# Patient Record
Sex: Female | Born: 1995 | Race: White | Hispanic: No | Marital: Married | Smoking: Never smoker
Health system: Southern US, Community
[De-identification: ages and names within clinical notes are randomized; demographics above are authoritative.]

## PROBLEM LIST (undated history)

## (undated) ENCOUNTER — Inpatient Hospital Stay: Payer: Self-pay

## (undated) ENCOUNTER — Emergency Department (HOSPITAL_COMMUNITY): Payer: Medicaid Other

## (undated) DIAGNOSIS — IMO0002 Reserved for concepts with insufficient information to code with codable children: Secondary | ICD-10-CM

## (undated) DIAGNOSIS — G510 Bell's palsy: Secondary | ICD-10-CM

## (undated) DIAGNOSIS — M5430 Sciatica, unspecified side: Secondary | ICD-10-CM

---
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
## 2016-01-23 ENCOUNTER — Ambulatory Visit (INDEPENDENT_AMBULATORY_CARE_PROVIDER_SITE_OTHER): Payer: Self-pay | Admitting: Otolaryngology

## 2016-06-05 ENCOUNTER — Emergency Department (HOSPITAL_COMMUNITY)
Admission: EM | Admit: 2016-06-05 | Discharge: 2016-06-05 | Disposition: A | Discharge status: Home / Self Care | Payer: Medicaid Other | Attending: Emergency Medicine | Admitting: Emergency Medicine

## 2016-06-05 ENCOUNTER — Encounter (HOSPITAL_COMMUNITY): Payer: Self-pay | Admitting: Cardiology

## 2016-06-05 DIAGNOSIS — R1013 Epigastric pain: Secondary | ICD-10-CM | POA: Diagnosis present

## 2016-06-05 DIAGNOSIS — R109 Unspecified abdominal pain: Secondary | ICD-10-CM

## 2016-06-05 DIAGNOSIS — R112 Nausea with vomiting, unspecified: Secondary | ICD-10-CM | POA: Diagnosis not present

## 2016-06-05 DIAGNOSIS — R51 Headache: Secondary | ICD-10-CM | POA: Diagnosis not present

## 2016-06-05 DIAGNOSIS — K5900 Constipation, unspecified: Secondary | ICD-10-CM | POA: Diagnosis not present

## 2016-06-05 DIAGNOSIS — M791 Myalgia: Secondary | ICD-10-CM | POA: Diagnosis not present

## 2016-06-05 LAB — CBC WITH DIFFERENTIAL/PLATELET
BASOS ABS: 0 10*3/uL (ref 0.0–0.1)
Basophils Relative: 0 %
EOS PCT: 1 %
Eosinophils Absolute: 0.1 10*3/uL (ref 0.0–0.7)
HCT: 36.7 % (ref 36.0–46.0)
Hemoglobin: 12.8 g/dL (ref 12.0–15.0)
LYMPHS PCT: 15 %
Lymphs Abs: 1.3 10*3/uL (ref 0.7–4.0)
MCH: 29.8 pg (ref 26.0–34.0)
MCHC: 34.9 g/dL (ref 30.0–36.0)
MCV: 85.5 fL (ref 78.0–100.0)
Monocytes Absolute: 0.7 10*3/uL (ref 0.1–1.0)
Monocytes Relative: 8 %
NEUTROS PCT: 76 %
Neutro Abs: 6.5 10*3/uL (ref 1.7–7.7)
PLATELETS: 298 10*3/uL (ref 150–400)
RBC: 4.29 MIL/uL (ref 3.87–5.11)
RDW: 13 % (ref 11.5–15.5)
WBC: 8.7 10*3/uL (ref 4.0–10.5)

## 2016-06-05 LAB — BASIC METABOLIC PANEL
ANION GAP: 6 (ref 5–15)
BUN: 16 mg/dL (ref 6–20)
CO2: 25 mmol/L (ref 22–32)
Calcium: 9.3 mg/dL (ref 8.9–10.3)
Chloride: 105 mmol/L (ref 101–111)
Creatinine, Ser: 0.73 mg/dL (ref 0.44–1.00)
Glucose, Bld: 91 mg/dL (ref 65–99)
POTASSIUM: 3.6 mmol/L (ref 3.5–5.1)
SODIUM: 136 mmol/L (ref 135–145)

## 2016-06-05 LAB — HEPATIC FUNCTION PANEL
ALT: 13 U/L — ABNORMAL LOW (ref 14–54)
AST: 17 U/L (ref 15–41)
Albumin: 4.6 g/dL (ref 3.5–5.0)
Alkaline Phosphatase: 77 U/L (ref 38–126)
BILIRUBIN DIRECT: 0.2 mg/dL (ref 0.1–0.5)
BILIRUBIN INDIRECT: 0.5 mg/dL (ref 0.3–0.9)
TOTAL PROTEIN: 7.7 g/dL (ref 6.5–8.1)
Total Bilirubin: 0.7 mg/dL (ref 0.3–1.2)

## 2016-06-05 LAB — URINALYSIS, ROUTINE W REFLEX MICROSCOPIC
Bilirubin Urine: NEGATIVE
Glucose, UA: NEGATIVE mg/dL
Ketones, ur: NEGATIVE mg/dL
LEUKOCYTES UA: NEGATIVE
NITRITE: NEGATIVE
PROTEIN: NEGATIVE mg/dL
Specific Gravity, Urine: >1.03 — ABNORMAL HIGH (ref 1.005–1.030)
pH: 5.5 (ref 5.0–8.0)

## 2016-06-05 LAB — LIPASE, BLOOD: LIPASE: 14 U/L (ref 11–51)

## 2016-06-05 LAB — URINE MICROSCOPIC-ADD ON: WBC, UA: NONE SEEN WBC/hpf (ref 0–5)

## 2016-06-05 LAB — PREGNANCY, URINE: PREG TEST UR: NEGATIVE

## 2016-06-05 MED ORDER — ONDANSETRON HCL 4 MG/2ML IJ SOLN
4.0000 mg | Freq: Once | INTRAMUSCULAR | Status: AC
  Administered 2016-06-05: 4 mg via INTRAVENOUS
  Filled 2016-06-05: qty 2

## 2016-06-05 MED ORDER — SODIUM CHLORIDE 0.9 % IV BOLUS (SEPSIS)
1000.0000 mL | Freq: Once | INTRAVENOUS | Status: AC
  Administered 2016-06-05: 1000 mL via INTRAVENOUS

## 2016-06-05 MED ORDER — GI COCKTAIL ~~LOC~~
30.0000 mL | Freq: Once | ORAL | Status: AC
  Administered 2016-06-05: 30 mL via ORAL
  Filled 2016-06-05: qty 30

## 2016-06-05 MED ORDER — ONDANSETRON 4 MG PO TBDP: ORAL_TABLET | ORAL | Status: DC

## 2016-06-05 NOTE — ED Provider Notes (Signed)
CSN: [id number]     Arrival date & time 06/05/16  1033 History  By signing my name below, I, Tanda RockersMargaux Venter, attest that this documentation has been prepared under the direction and in the presence of Marily MemosJason Barnes Florek, MD. Electronically Signed: Tanda RockersMargaux Venter, ED Scribe. 06/05/2016. 12:03 PM    Chief Complaint  Patient presents with  . Abdominal Pain   The history is provided by the patient. No language interpreter was used.  HPI Comments: Donna Reeves is a 20 y.o. female who presents to the Emergency Department complaining of sudden onset, sharp epigastric pain onset yesterday, worsening this morning while at work. Pain is worsened by palpitation. She notes associated frequent urination, headache, body aches, constipation, and one prior episode of vomiting yesterday immediately after eating chicken tenders. No suspicious food intake. Pt is currently on her menstrual period. She denies sick contact. No recent foreign travel. No fever, sore throat, diarrhea., rash, other urinary or vaginal issues.   Past Medical History  Diagnosis Date  . Degenerative disc disease   . Herniated disc    History reviewed. No pertinent past surgical history. History reviewed. No pertinent family history. Social History  Substance Use Topics  . Smoking status: Never Smoker   . Smokeless tobacco: None  . Alcohol Use: No   OB History    Gravida Para Term Preterm AB TAB SAB Ectopic Multiple Living   0 0 0 0 0 0 0 0 0 0      Review of Systems  Constitutional: Negative for fever.  HENT: Negative for sore throat.   Gastrointestinal: Positive for nausea, vomiting, abdominal pain and constipation. Negative for diarrhea.  Genitourinary: Positive for frequency. Negative for dysuria, vaginal discharge, difficulty urinating and menstrual problem.  Musculoskeletal: Positive for myalgias.  Neurological: Positive for headaches.  All other systems reviewed and are negative.  Allergies  Ultram and Augmentin  Home  Medications   Prior to Admission medications   Medication Sig Start Date End Date Taking? Authorizing Provider  ondansetron (ZOFRAN ODT) 4 MG disintegrating tablet 4mg  ODT q4 hours prn nausea/vomit 06/05/16   Marily MemosJason Georgi Tuel, MD   BP 120/83 mmHg  Pulse 100  Temp(Src) 97.7 F (36.5 C) (Oral)  Resp 16  Ht 5\' 3"  (1.6 m)  Wt 102 lb (46.267 kg)  BMI 18.07 kg/m2  SpO2 100%  LMP 06/02/2016 Physical Exam  Constitutional: She is oriented to person, place, and time. She appears well-developed and well-nourished. No distress.  HENT:  Head: Normocephalic and atraumatic.  Eyes: Conjunctivae and EOM are normal.  Neck: Neck supple. No tracheal deviation present.  Cardiovascular: Normal rate, regular rhythm and normal heart sounds.   Pulmonary/Chest: Effort normal and breath sounds normal. No respiratory distress. She has no wheezes. She has no rales.  Abdominal: She exhibits no mass. There is tenderness. There is no rebound and no guarding.  Suprapubic and epigastric tenderness no rebound or guarding   Musculoskeletal: Normal range of motion.  Neurological: She is alert and oriented to person, place, and time.  Skin: Skin is warm and dry.  Psychiatric: She has a normal mood and affect. Her behavior is normal.  Nursing note and vitals reviewed.   ED Course  Procedures  DIAGNOSTIC STUDIES: Oxygen Saturation is 100% on RA, normal by my interpretation.    COORDINATION OF CARE: 11:20 AM-Discussed treatment plan which includes Lipase with pt at bedside and pt agreed to plan.   Labs Review Labs Reviewed  URINALYSIS, ROUTINE W REFLEX MICROSCOPIC ([facility]) -  Abnormal; Notable for the following:    Specific Gravity, Urine >1.030 (*)    Hgb urine dipstick LARGE (*)    All other components within normal limits  URINE MICROSCOPIC-ADD ON - Abnormal; Notable for the following:    Squamous Epithelial / LPF TOO NUMEROUS TO COUNT (*)    Bacteria, UA FEW (*)    All other components within normal  limits  HEPATIC FUNCTION PANEL - Abnormal; Notable for the following:    ALT 13 (*)    All other components within normal limits  PREGNANCY, URINE  BASIC METABOLIC PANEL  CBC WITH DIFFERENTIAL/PLATELET  LIPASE, BLOOD    Imaging Review No results found. I have personally reviewed and evaluated these lab results as part of my medical decision-making.   EKG Interpretation None      MDM   Final diagnoses:  Abdominal pain, unspecified abdominal location    Possibly mild food poisoning as a cause of her symptoms. Workup here is negative. Repeat abdominal exam is benign. Patient tolerating by mouth. Will return for any worsening symptoms.  New Prescriptions: Discharge Medication List as of 06/05/2016 12:58 PM    START taking these medications   Details  ondansetron (ZOFRAN ODT) 4 MG disintegrating tablet  ODT q4 hours prn nausea/vomit, Print         I have personally and contemperaneously reviewed labs and imaging and used in my decision making as above.   A medical screening exam was performed and I feel the patient has had an appropriate workup for their chief complaint at this time and likelihood of emergent condition existing is low and thus workup can continue on an outpatient basis.. Their vital signs are stable. They have been counseled on decision, discharge, follow up and which symptoms necessitate immediate return to the emergency department.  They verbally stated understanding and agreement with plan and discharged in stable condition.    I personally performed the services described in this documentation, which was scribed in my presence. The recorded information has been reviewed and is accurate.     Marily Memos, MD 06/05/16 1538

## 2016-06-05 NOTE — ED Notes (Addendum)
Epigastric pain since yesterday.  Vomited times 3.  Headache and achy today.  Also concerned about exposure to STD.  And possible pregnancy

## 2016-08-03 ENCOUNTER — Emergency Department (HOSPITAL_COMMUNITY): Payer: Medicaid Other

## 2016-08-03 ENCOUNTER — Emergency Department (HOSPITAL_COMMUNITY)
Admission: EM | Admit: 2016-08-03 | Discharge: 2016-08-03 | Disposition: A | Discharge status: Home / Self Care | Payer: Medicaid Other | Attending: Emergency Medicine | Admitting: Emergency Medicine

## 2016-08-03 ENCOUNTER — Encounter (HOSPITAL_COMMUNITY): Payer: Self-pay | Admitting: Emergency Medicine

## 2016-08-03 DIAGNOSIS — Z3490 Encounter for supervision of normal pregnancy, unspecified, unspecified trimester: Secondary | ICD-10-CM

## 2016-08-03 DIAGNOSIS — R102 Pelvic and perineal pain: Secondary | ICD-10-CM | POA: Diagnosis not present

## 2016-08-03 DIAGNOSIS — O26891 Other specified pregnancy related conditions, first trimester: Secondary | ICD-10-CM | POA: Diagnosis present

## 2016-08-03 LAB — BASIC METABOLIC PANEL
Anion gap: 5 (ref 5–15)
BUN: 10 mg/dL (ref 6–20)
CO2: 24 mmol/L (ref 22–32)
Calcium: 8.9 mg/dL (ref 8.9–10.3)
Chloride: 106 mmol/L (ref 101–111)
Creatinine, Ser: 0.53 mg/dL (ref 0.44–1.00)
GFR calc Af Amer: >60 mL/min (ref >60)
GLUCOSE: 88 mg/dL (ref 65–99)
POTASSIUM: 3.7 mmol/L (ref 3.5–5.1)
Sodium: 135 mmol/L (ref 135–145)

## 2016-08-03 LAB — CBC WITH DIFFERENTIAL/PLATELET
BASOS ABS: 0 10*3/uL (ref 0.0–0.1)
Basophils Relative: 0 %
EOS PCT: 2 %
Eosinophils Absolute: 0.2 10*3/uL (ref 0.0–0.7)
HCT: 33.6 % — ABNORMAL LOW (ref 36.0–46.0)
Hemoglobin: 12 g/dL (ref 12.0–15.0)
LYMPHS ABS: 2 10*3/uL (ref 0.7–4.0)
LYMPHS PCT: 19 %
MCH: 30.8 pg (ref 26.0–34.0)
MCHC: 35.7 g/dL (ref 30.0–36.0)
MCV: 86.4 fL (ref 78.0–100.0)
MONO ABS: 0.8 10*3/uL (ref 0.1–1.0)
Monocytes Relative: 8 %
Neutro Abs: 7.6 10*3/uL (ref 1.7–7.7)
Neutrophils Relative %: 71 %
PLATELETS: 287 10*3/uL (ref 150–400)
RBC: 3.89 MIL/uL (ref 3.87–5.11)
RDW: 12.8 % (ref 11.5–15.5)
WBC: 10.6 10*3/uL — ABNORMAL HIGH (ref 4.0–10.5)

## 2016-08-03 LAB — URINALYSIS, ROUTINE W REFLEX MICROSCOPIC
BILIRUBIN URINE: NEGATIVE
Glucose, UA: NEGATIVE mg/dL
HGB URINE DIPSTICK: NEGATIVE
KETONES UR: NEGATIVE mg/dL
Leukocytes, UA: NEGATIVE
Nitrite: NEGATIVE
Protein, ur: NEGATIVE mg/dL
SPECIFIC GRAVITY, URINE: 1.025 (ref 1.005–1.030)
pH: 6.5 (ref 5.0–8.0)

## 2016-08-03 LAB — WET PREP, GENITAL
Clue Cells Wet Prep HPF POC: NONE SEEN
Sperm: NONE SEEN
Trich, Wet Prep: NONE SEEN
Yeast Wet Prep HPF POC: NONE SEEN

## 2016-08-03 LAB — PREGNANCY, URINE: PREG TEST UR: POSITIVE — AB

## 2016-08-03 LAB — HCG, QUANTITATIVE, PREGNANCY: hCG, Beta Chain, Quant, S: 25210 m[IU]/mL — ABNORMAL HIGH (ref <5)

## 2016-08-03 MED ORDER — SODIUM CHLORIDE 0.9 % IV BOLUS (SEPSIS)
1000.0000 mL | Freq: Once | INTRAVENOUS | Status: AC
  Administered 2016-08-03: 1000 mL via INTRAVENOUS

## 2016-08-03 MED ORDER — GNP PRENATAL VITAMINS 28-0.8 MG PO TABS: 1.0000 | ORAL_TABLET | Freq: Every day | ORAL | 1 refills | Status: DC

## 2016-08-03 NOTE — ED Triage Notes (Signed)
Pt c/o lower abd cramping for a while and states she had a positive pregnancy test last week. Pt denies any vaginal discharge.

## 2016-08-03 NOTE — ED Provider Notes (Signed)
[facility] Provider Note   CSN: [id number] Arrival date & time: 08/03/16  [id number]  First Provider Contact:  First MD Initiated Contact with Patient 08/03/16 1923        History   Chief Complaint Chief Complaint  Patient presents with  . Abdominal Pain    HPI Donna Reeves is a 20 y.o. female.  Pt is here today with lower abdominal pain and cramping.  She took 2 pregnancy tests at home and both were positive.  Pt denies any bleeding.  She has called the obgyn and has made an appointment, but has not yet been seen.   The history is provided by the patient.  Abdominal Pain      Past Medical History:  Diagnosis Date  . Degenerative disc disease   . Herniated disc     Patient Active Problem List   Diagnosis Date Noted  . Low back pain 03/28/2013    History reviewed. No pertinent surgical history.  OB History    Gravida Para Term Preterm AB Living   0 0 0 0 0 0   SAB TAB Ectopic Multiple Live Births   0 0 0 0         Home Medications    Prior to Admission medications   Medication Sig Start Date End Date Taking? Authorizing Provider  Prenatal Vit-Fe Fumarate-FA (GNP PRENATAL VITAMINS) 28-0.8 MG TABS Take 1 tablet by mouth daily. 08/03/16   Zadie Rhineonald Wickline, MD    Family History No family history on file.  Social History Social History  Substance Use Topics  . Smoking status: Never Smoker  . Smokeless tobacco: Never Used  . Alcohol use No     Allergies   Ultram [tramadol] and Augmentin [amoxicillin-pot clavulanate]   Review of Systems Review of Systems  Gastrointestinal: Positive for abdominal pain.  Genitourinary: Positive for vaginal discharge.  All other systems reviewed and are negative.    Physical Exam Updated Vital Signs BP 110/70   Pulse 76   Temp 98.4 F (36.9 C)   Resp 16   Ht 5\' 3"  (1.6 m)   Wt 115 lb (52.2 kg)   LMP 07/01/2016   SpO2 100%   BMI 20.37 kg/m   Physical Exam  Constitutional: She is oriented to  person, place, and time. She appears well-developed and well-nourished.  HENT:  Head: Normocephalic and atraumatic.  Right Ear: External ear normal.  Left Ear: External ear normal.  Nose: Nose normal.  Mouth/Throat: Oropharynx is clear and moist.  Eyes: Conjunctivae and EOM are normal. Pupils are equal, round, and reactive to light.  Neck: Normal range of motion. Neck supple.  Cardiovascular: Normal rate, regular rhythm, normal heart sounds and intact distal pulses.   Pulmonary/Chest: Effort normal and breath sounds normal.  Abdominal: Soft. There is tenderness in the suprapubic area.  Genitourinary: Uterus normal. Pelvic exam was performed with patient supine. Cervix exhibits discharge. Right adnexum displays no tenderness. Left adnexum displays no tenderness. Vaginal discharge found.  Musculoskeletal: Normal range of motion.  Neurological: She is alert and oriented to person, place, and time. She has normal reflexes.  Skin: Skin is warm and dry.  Psychiatric: She has a normal mood and affect. Her behavior is normal. Judgment and thought content normal.  Nursing note and vitals reviewed.    ED Treatments / Results  Labs (all labs ordered are listed, but only abnormal results are displayed) Labs Reviewed  WET PREP, GENITAL - Abnormal; Notable for the following:  Result Value   WBC, Wet Prep HPF POC FEW (*)    All other components within normal limits  PREGNANCY, URINE - Abnormal; Notable for the following:    Preg Test, Ur POSITIVE (*)    All other components within normal limits  CBC WITH DIFFERENTIAL/PLATELET - Abnormal; Notable for the following:    WBC 10.6 (*)    HCT 33.6 (*)    All other components within normal limits  HCG, QUANTITATIVE, PREGNANCY - Abnormal; Notable for the following:    hCG, Beta Chain, Quant, S 25,210 (*)    All other components within normal limits  URINALYSIS, ROUTINE W REFLEX MICROSCOPIC (NOT AT [facility])  BASIC METABOLIC PANEL  GC/CHLAMYDIA  PROBE AMP ([place]) NOT AT [facility]    EKG  EKG Interpretation None       Radiology US Ob Comp Less 14 Wks  Result Date: 08/03/2016 CLINICAL DATA:  Pelvic pain for 2 weeks.  Patient is pregnant. EXAM: OBSTETRIC <14 WK [place] AND TRANSVAGINAL OB US TECHNIQUE: Both transabdominal and transvaginal ultrasound examinations were performed for complete evaluation of the gestation as well as the maternal uterus, adnexal regions, and pelvic cul-de-sac. Transvaginal technique was performed to assess early pregnancy. COMPARISON:  None. FINDINGS: Intrauterine gestational sac: Present Yolk sac:  Present Embryo:  Present Cardiac Activity: [place] Heart Rate: 99  bpm MSD:   mm    w     d CRL:  5.3 mm  mm   6 w   2 d                  [place] EDC: 03/28/2017 Subchorionic hemorrhage:  None visualized. Maternal uterus/adnexae: Normal right ovary. Normal left ovary. Trace free pelvic fluid. IMPRESSION: Single living intrauterine embryo estimated at 6 weeks and 2 days gestation. Low heart rate of 99 beats per minute. No subchorionic hemorrhage. Normal ovaries. Electronically Signed   By: Rudie Meyer M.D.   On: 08/03/2016 21:54   US Ob Transvaginal  Result Date: 08/03/2016 CLINICAL DATA:  Pelvic pain for 2 weeks.  Patient is pregnant. EXAM: OBSTETRIC <14 WK [place] AND TRANSVAGINAL OB US TECHNIQUE: Both transabdominal and transvaginal ultrasound examinations were performed for complete evaluation of the gestation as well as the maternal uterus, adnexal regions, and pelvic cul-de-sac. Transvaginal technique was performed to assess early pregnancy. COMPARISON:  None. FINDINGS: Intrauterine gestational sac: Present Yolk sac:  Present Embryo:  Present Cardiac Activity: [place] Heart Rate: 99  bpm MSD:   mm    w     d CRL:  5.3 mm  mm   6 w   2 d                  [place] EDC: 03/28/2017 Subchorionic hemorrhage:  None visualized. Maternal uterus/adnexae: Normal right ovary. Normal left ovary. Trace free pelvic fluid. IMPRESSION: Single living  intrauterine embryo estimated at 6 weeks and 2 days gestation. Low heart rate of 99 beats per minute. No subchorionic hemorrhage. Normal ovaries. Electronically Signed   By: Rudie Meyer M.D.   On: 08/03/2016 21:54    Procedures Procedures (including critical care time)  Medications Ordered in ED Medications  sodium chloride 0.9 % bolus 1,000 mL (0 mLs Intravenous Stopped 08/03/16 2158)     Initial Impression / Assessment and Plan / ED Course  I have reviewed the triage vital signs and the nursing notes.  Pertinent labs & imaging results that were available during my care of the patient were reviewed by  me and considered in my medical decision making (see chart for details).  Clinical Course   Pt's [place] is pending and pt will be signed out to Dr. Bebe Shaggy.   Final Clinical Impressions(s) / ED Diagnoses   Final diagnoses:  Pregnancy    New Prescriptions Discharge Medication List as of 08/03/2016 10:09 PM    START taking these medications   Details  Prenatal Vit-Fe Fumarate-FA (GNP PRENATAL VITAMINS) 28-0.8 MG TABS Take 1 tablet by mouth daily., Starting Mon 08/03/2016, Print         Jacalyn Lefevre, MD 08/04/16 [phone]

## 2016-08-03 NOTE — ED Provider Notes (Signed)
US imaging reveals IUP No other acute findings Pt stable She feels well for d/c home We discussed return precautions Referred to local OBGYN Rx for prenatal vitamins    Zadie Rhineonald Tammie Ellsworth, MD 08/03/16 2212

## 2016-08-04 LAB — GC/CHLAMYDIA PROBE AMP (~~LOC~~) NOT AT ARMC
CHLAMYDIA, DNA PROBE: NEGATIVE
NEISSERIA GONORRHEA: NEGATIVE

## 2016-08-18 LAB — OB RESULTS CONSOLE ABO/RH: RH TYPE: POSITIVE

## 2016-08-18 LAB — OB RESULTS CONSOLE ANTIBODY SCREEN: ANTIBODY SCREEN: NEGATIVE

## 2016-08-20 LAB — OB RESULTS CONSOLE HIV ANTIBODY (ROUTINE TESTING): HIV: NONREACTIVE

## 2016-08-20 LAB — OB RESULTS CONSOLE HEPATITIS B SURFACE ANTIGEN: Hepatitis B Surface Ag: NEGATIVE

## 2016-08-20 LAB — OB RESULTS CONSOLE RUBELLA ANTIBODY, IGM: RUBELLA: NON-IMMUNE/NOT IMMUNE

## 2016-08-25 ENCOUNTER — Emergency Department (HOSPITAL_COMMUNITY)
Admission: EM | Admit: 2016-08-25 | Discharge: 2016-08-25 | Disposition: A | Discharge status: Home / Self Care | Payer: Medicaid Other | Attending: Emergency Medicine | Admitting: Emergency Medicine

## 2016-08-25 ENCOUNTER — Encounter (HOSPITAL_COMMUNITY): Payer: Self-pay

## 2016-08-25 ENCOUNTER — Emergency Department (HOSPITAL_COMMUNITY): Payer: Medicaid Other

## 2016-08-25 DIAGNOSIS — R55 Syncope and collapse: Secondary | ICD-10-CM | POA: Diagnosis not present

## 2016-08-25 DIAGNOSIS — R109 Unspecified abdominal pain: Secondary | ICD-10-CM | POA: Insufficient documentation

## 2016-08-25 DIAGNOSIS — R51 Headache: Secondary | ICD-10-CM | POA: Diagnosis not present

## 2016-08-25 DIAGNOSIS — O219 Vomiting of pregnancy, unspecified: Secondary | ICD-10-CM

## 2016-08-25 DIAGNOSIS — Z79899 Other long term (current) drug therapy: Secondary | ICD-10-CM | POA: Insufficient documentation

## 2016-08-25 DIAGNOSIS — Z3A08 8 weeks gestation of pregnancy: Secondary | ICD-10-CM | POA: Insufficient documentation

## 2016-08-25 DIAGNOSIS — R059 Cough, unspecified: Secondary | ICD-10-CM

## 2016-08-25 DIAGNOSIS — R05 Cough: Secondary | ICD-10-CM | POA: Diagnosis not present

## 2016-08-25 DIAGNOSIS — R519 Headache, unspecified: Secondary | ICD-10-CM

## 2016-08-25 LAB — URINALYSIS, ROUTINE W REFLEX MICROSCOPIC
Glucose, UA: NEGATIVE mg/dL
Hgb urine dipstick: NEGATIVE
LEUKOCYTES UA: NEGATIVE
NITRITE: NEGATIVE
PH: 6 (ref 5.0–8.0)
PROTEIN: 30 mg/dL — AB
Specific Gravity, Urine: >1.03 — ABNORMAL HIGH (ref 1.005–1.030)

## 2016-08-25 LAB — CBC WITH DIFFERENTIAL/PLATELET
Basophils Absolute: 0 10*3/uL (ref 0.0–0.1)
Basophils Relative: 0 %
EOS ABS: 0.1 10*3/uL (ref 0.0–0.7)
EOS PCT: 0 %
HCT: 38.5 % (ref 36.0–46.0)
HEMOGLOBIN: 13.7 g/dL (ref 12.0–15.0)
LYMPHS ABS: 1.2 10*3/uL (ref 0.7–4.0)
LYMPHS PCT: 7 %
MCH: 30.6 pg (ref 26.0–34.0)
MCHC: 35.6 g/dL (ref 30.0–36.0)
MCV: 85.9 fL (ref 78.0–100.0)
MONOS PCT: 6 %
Monocytes Absolute: 1 10*3/uL (ref 0.1–1.0)
Neutro Abs: 14.4 10*3/uL — ABNORMAL HIGH (ref 1.7–7.7)
Neutrophils Relative %: 87 %
PLATELETS: 316 10*3/uL (ref 150–400)
RBC: 4.48 MIL/uL (ref 3.87–5.11)
RDW: 12.5 % (ref 11.5–15.5)
WBC: 16.6 10*3/uL — ABNORMAL HIGH (ref 4.0–10.5)

## 2016-08-25 LAB — RAPID STREP SCREEN (MED CTR MEBANE ONLY): Streptococcus, Group A Screen (Direct): NEGATIVE

## 2016-08-25 LAB — URINE MICROSCOPIC-ADD ON

## 2016-08-25 LAB — COMPREHENSIVE METABOLIC PANEL
ALK PHOS: 61 U/L (ref 38–126)
ALT: 15 U/L (ref 14–54)
ANION GAP: 10 (ref 5–15)
AST: 17 U/L (ref 15–41)
Albumin: 4.7 g/dL (ref 3.5–5.0)
BUN: 12 mg/dL (ref 6–20)
CALCIUM: 9.7 mg/dL (ref 8.9–10.3)
CO2: 22 mmol/L (ref 22–32)
CREATININE: 0.47 mg/dL (ref 0.44–1.00)
Chloride: 103 mmol/L (ref 101–111)
Glucose, Bld: 83 mg/dL (ref 65–99)
Potassium: 3.5 mmol/L (ref 3.5–5.1)
SODIUM: 135 mmol/L (ref 135–145)
Total Bilirubin: 1.2 mg/dL (ref 0.3–1.2)
Total Protein: 7.9 g/dL (ref 6.5–8.1)

## 2016-08-25 MED ORDER — SODIUM CHLORIDE 0.9 % IV BOLUS (SEPSIS)
1000.0000 mL | Freq: Once | INTRAVENOUS | Status: AC
  Administered 2016-08-25: 1000 mL via INTRAVENOUS

## 2016-08-25 MED ORDER — ONDANSETRON 4 MG PO TBDP: ORAL_TABLET | ORAL | 0 refills | Status: DC

## 2016-08-25 MED ORDER — ONDANSETRON HCL 4 MG/2ML IJ SOLN
4.0000 mg | Freq: Once | INTRAMUSCULAR | Status: AC
  Administered 2016-08-25: 4 mg via INTRAVENOUS
  Filled 2016-08-25: qty 2

## 2016-08-25 MED ORDER — ACETAMINOPHEN 500 MG PO TABS
1000.0000 mg | ORAL_TABLET | Freq: Once | ORAL | Status: AC
  Administered 2016-08-25: 1000 mg via ORAL
  Filled 2016-08-25: qty 2

## 2016-08-25 NOTE — Discharge Instructions (Signed)
If you were given medicines take as directed.  If you are on coumadin or contraceptives realize their levels and effectiveness is altered by many different medicines.  If you have any reaction (rash, tongues swelling, other) to the medicines stop taking and see a physician.    If your blood pressure was elevated in the ER make sure you follow up for management with a primary doctor or return for chest pain, shortness of breath or stroke symptoms.  Please follow up as directed and return to the ER or see a physician for new or worsening symptoms.  Thank you. Vitals:   08/25/16 1230 08/25/16 1300 08/25/16 1330 08/25/16 1345  BP: 112/69 100/62 98/62   Pulse: 69 65    Resp: 17 16  22   Temp:      TempSrc:      SpO2: 100% 97%    Weight:      Height:

## 2016-08-25 NOTE — ED Notes (Signed)
Pt ambulated to BR without difficulty

## 2016-08-25 NOTE — ED Triage Notes (Signed)
Pt reports is approx 9 or [redacted] weeks pregnant.  Reports has had a headache and pain behind ears for the past 2 days.  Pt says last night she was in the shower and passed out and hit her head.  Pt also says has been having n/v.

## 2016-08-25 NOTE — ED Provider Notes (Signed)
[facility] Provider Note   CSN: [id number] Arrival date & time: 08/25/16  1030  By signing my name below, I, [name], attest that this documentation has been prepared under the direction and in the presence of Blane Ohara, MD. Electronically Signed: [name], ED Scribe. 08/25/16. 11:29 AM.   History   Chief Complaint Chief Complaint  Patient presents with  . Migraine    HPI  Donna Reeves is a 20 y.o. female who presents to the Emergency Department complaining of constant, pressure, HA onset 2 days. Pt notes that last night she was in the shower when she felt lightheaded and passed out and struck her head on the tub. Pt reports that she is approximately 2 months pregnant with adequate OB follow up with her next appointment on 09/02/2016. Pt states that this is her first pregnancy.  Pt is having associated symptoms of chills, bilateral ear pain, lightheadedness, lower abdominal pain, nausea, and SOB. Pt denies any new sexual partners at this time. She notes that she has not tried any medications for the relief of her symptoms. She denies fever, vaginal bleeding, vaginal discharge, and any other symptoms. Denies hx of DVT, PE, GERD, issues with gallbladder, miscarriages, or ectopic pregnancy.   The history is provided by the patient. No language interpreter was used.  Migraine  This is a new problem. The current episode started more than 2 days ago. The problem occurs rarely. The problem has not changed since onset.Associated symptoms include abdominal pain (lower) and headaches. Pertinent negatives include no shortness of breath. Nothing aggravates the symptoms. Nothing relieves the symptoms. She has tried nothing for the symptoms. The treatment provided no relief.    Past Medical History:  Diagnosis Date  . Degenerative disc disease   . Herniated disc     Patient Active Problem List   Diagnosis Date Noted  . Low back pain 03/28/2013    History reviewed. No  pertinent surgical history.  OB History    Gravida Para Term Preterm AB Living   1 0 0 0 0 0   SAB TAB Ectopic Multiple Live Births   0 0 0 0         Home Medications    Prior to Admission medications   Medication Sig Start Date End Date Taking? Authorizing Provider  Prenatal Vit-Fe Fumarate-FA (GNP PRENATAL VITAMINS) 28-0.8 MG TABS Take 1 tablet by mouth daily. 08/03/16  Yes Zadie Rhine, MD  ondansetron (ZOFRAN ODT) 4 MG disintegrating tablet 4mg  ODT q4 hours prn nausea/vomit 08/25/16   Blane Ohara, MD    Family History No family history on file.  Social History Social History  Substance Use Topics  . Smoking status: Never Smoker  . Smokeless tobacco: Never Used  . Alcohol use No     Allergies   Ultram [tramadol] and Augmentin [amoxicillin-pot clavulanate]   Review of Systems Review of Systems  Constitutional: Positive for chills. Negative for fever.  HENT: Positive for ear pain.   Respiratory: Positive for cough and chest tightness. Negative for shortness of breath.   Gastrointestinal: Positive for abdominal pain (lower) and nausea.  Genitourinary: Negative for vaginal bleeding and vaginal discharge.  Neurological: Positive for syncope, light-headedness and headaches.  All other systems reviewed and are negative.    Physical Exam Updated Vital Signs BP 98/62   Pulse 65   Temp 98 F (36.7 C) (Oral)   Resp 22   Ht 5\' 3"  (1.6 m)   Wt 106 lb (48.1 kg)  LMP 07/01/2016   SpO2 97%   BMI 18.78 kg/m   Physical Exam  Constitutional: She is oriented to person, place, and time. She appears well-developed and well-nourished. No distress.  HENT:  Head: Normocephalic and atraumatic.  Eyes: EOM are normal.  Visual fields intact to finger movements.   Neck: Neck supple.  Cardiovascular: Normal rate, regular rhythm and normal heart sounds.  Exam reveals no gallop and no friction rub.   No murmur heard. Pulmonary/Chest: Effort normal and breath sounds normal.  No respiratory distress. She has no wheezes. She has no rales.  Abdominal: Soft. She exhibits no distension. There is tenderness in the epigastric area. There is no guarding.  Musculoskeletal: Normal range of motion. She exhibits no edema.  No BLE swelling  Neurological: She is alert and oriented to person, place, and time.  Finger-to-nose intact. Equal strength to BUE.  Skin: Skin is warm and dry.  Psychiatric: She has a normal mood and affect. Her behavior is normal.  Nursing note and vitals reviewed.    ED Treatments / Results  DIAGNOSTIC STUDIES: Oxygen Saturation is 100% on RA, nl by my interpretation.    COORDINATION OF CARE: 11:26 AM Discussed treatment plan with pt at bedside which includes EKG, IV fluids, zofran, labs, tylenol and pt agreed to plan.   Labs (all labs ordered are listed, but only abnormal results are displayed) Labs Reviewed  CBC WITH DIFFERENTIAL/PLATELET - Abnormal; Notable for the following:       Result Value   WBC 16.6 (*)    Neutro Abs 14.4 (*)    All other components within normal limits  URINALYSIS, ROUTINE W REFLEX MICROSCOPIC (NOT AT [facility]) - Abnormal; Notable for the following:    Specific Gravity, Urine >1.030 (*)    Bilirubin Urine SMALL (*)    Ketones, ur >80 (*)    Protein, ur 30 (*)    All other components within normal limits  URINE MICROSCOPIC-ADD ON - Abnormal; Notable for the following:    Squamous Epithelial / LPF TOO NUMEROUS TO COUNT (*)    Bacteria, UA FEW (*)    All other components within normal limits  RAPID STREP SCREEN (NOT AT [facility])  URINE CULTURE  COMPREHENSIVE METABOLIC PANEL    EKG  EKG Interpretation  Date/Time:  Tuesday August 25 2016 11:15:55 EDT Ventricular Rate:  55 PR Interval:    QRS Duration: 97 QT Interval:  452 QTC Calculation: 433 R Axis:   89 Text Interpretation:  Sinus arrhythmia Prolonged PR interval Baseline wander in lead(s) V1 Confirmed by Chailyn Racette MD, Melady Chow [phone]([id number]) on 08/25/2016 1:49:20 PM        Radiology Dg Chest 2 View  Result Date: 08/25/2016 CLINICAL DATA:  Cough for 3 weeks EXAM: CHEST  2 VIEW COMPARISON:  None. FINDINGS: Lungs are clear. Heart size and pulmonary vascularity are normal. No adenopathy. No bone lesions. IMPRESSION: No edema or consolidation. Electronically Signed   By: Bretta BangWilliam  Woodruff III M.D.   On: 08/25/2016 14:03    Procedures Procedures (including critical care time)  Medications Ordered in ED Medications  sodium chloride 0.9 % bolus 1,000 mL (0 mLs Intravenous Stopped 08/25/16 1312)  acetaminophen (TYLENOL) tablet 1,000 mg (1,000 mg Oral Given 08/25/16 1144)  ondansetron (ZOFRAN) injection 4 mg (4 mg Intravenous Given 08/25/16 1144)     Initial Impression / Assessment and Plan / ED Course  I have reviewed the triage vital signs and the nursing notes.  Pertinent labs & imaging results that were  available during my care of the patient were reviewed by me and considered in my medical decision making (see chart for details).  Clinical Course   Patient presents with multiple different symptoms. Recurrent nausea and vomiting since pregnancy with lightheaded and syncope yesterday. Mild chest discomfort and coughing.  No SOB.  No other PE risk factors, vitals unremarkable.    Pt has wbc elevation, UA and CXR unremarkable, strep pending. UA consistent with dehydration, IV fluids given.  Recent GC/ vaginitis neg, reviewed, no new sexual partners or new dc/ bleeding.    Pt improved on reassessment.  Discussed close fup outpt with OB and pcp. Smiling at dc, tolerating po Results and differential diagnosis were discussed with the patient/parent/guardian. Xrays were independently reviewed by myself.  Close follow up outpatient was discussed, comfortable with the plan.   Medications  sodium chloride 0.9 % bolus 1,000 mL (0 mLs Intravenous Stopped 08/25/16 1312)  acetaminophen (TYLENOL) tablet 1,000 mg (1,000 mg Oral Given 08/25/16 1144)  ondansetron  (ZOFRAN) injection 4 mg (4 mg Intravenous Given 08/25/16 1144)    Vitals:   08/25/16 1230 08/25/16 1300 08/25/16 1330 08/25/16 1345  BP: 112/69 100/62 98/62   Pulse: 69 65    Resp: 17 16  22   Temp:      TempSrc:      SpO2: 100% 97%    Weight:      Height:        Final diagnoses:  Nausea/vomiting in pregnancy  Headache, unspecified headache type  Cough  Syncope and collapse     Final Clinical Impressions(s) / ED Diagnoses   Final diagnoses:  Nausea/vomiting in pregnancy  Headache, unspecified headache type  Cough  Syncope and collapse    New Prescriptions New Prescriptions   ONDANSETRON (ZOFRAN ODT) 4 MG DISINTEGRATING TABLET    4mg  ODT q4 hours prn nausea/vomit      Blane Ohara, MD 08/25/16 1515

## 2016-08-27 LAB — URINE CULTURE

## 2016-08-28 LAB — CULTURE, GROUP A STREP (THRC)

## 2016-11-16 ENCOUNTER — Encounter (HOSPITAL_COMMUNITY): Payer: Self-pay | Admitting: Emergency Medicine

## 2016-11-16 ENCOUNTER — Emergency Department (HOSPITAL_COMMUNITY)
Admission: EM | Admit: 2016-11-16 | Discharge: 2016-11-16 | Disposition: A | Discharge status: Home / Self Care | Payer: Medicaid Other | Attending: Emergency Medicine | Admitting: Emergency Medicine

## 2016-11-16 DIAGNOSIS — Z79899 Other long term (current) drug therapy: Secondary | ICD-10-CM | POA: Diagnosis not present

## 2016-11-16 DIAGNOSIS — O26892 Other specified pregnancy related conditions, second trimester: Secondary | ICD-10-CM | POA: Diagnosis not present

## 2016-11-16 DIAGNOSIS — N898 Other specified noninflammatory disorders of vagina: Secondary | ICD-10-CM | POA: Insufficient documentation

## 2016-11-16 DIAGNOSIS — Z3A21 21 weeks gestation of pregnancy: Secondary | ICD-10-CM

## 2016-11-16 DIAGNOSIS — R109 Unspecified abdominal pain: Secondary | ICD-10-CM | POA: Diagnosis not present

## 2016-11-16 LAB — CBC WITH DIFFERENTIAL/PLATELET
Basophils Absolute: 0 10*3/uL (ref 0.0–0.1)
Basophils Relative: 0 %
EOS ABS: 0.1 10*3/uL (ref 0.0–0.7)
EOS PCT: 1 %
HCT: 32.4 % — ABNORMAL LOW (ref 36.0–46.0)
HEMOGLOBIN: 11.4 g/dL — AB (ref 12.0–15.0)
LYMPHS ABS: 1.7 10*3/uL (ref 0.7–4.0)
Lymphocytes Relative: 11 %
MCH: 31.1 pg (ref 26.0–34.0)
MCHC: 35.2 g/dL (ref 30.0–36.0)
MCV: 88.3 fL (ref 78.0–100.0)
MONO ABS: 0.9 10*3/uL (ref 0.1–1.0)
MONOS PCT: 6 %
Neutro Abs: 12.6 10*3/uL — ABNORMAL HIGH (ref 1.7–7.7)
Neutrophils Relative %: 82 %
Platelets: 265 10*3/uL (ref 150–400)
RBC: 3.67 MIL/uL — ABNORMAL LOW (ref 3.87–5.11)
RDW: 12.8 % (ref 11.5–15.5)
WBC: 15.4 10*3/uL — ABNORMAL HIGH (ref 4.0–10.5)

## 2016-11-16 LAB — URINALYSIS, ROUTINE W REFLEX MICROSCOPIC
BILIRUBIN URINE: NEGATIVE
GLUCOSE, UA: NEGATIVE mg/dL
HGB URINE DIPSTICK: NEGATIVE
Ketones, ur: 15 mg/dL — AB
Leukocytes, UA: NEGATIVE
Nitrite: NEGATIVE
PH: 6.5 (ref 5.0–8.0)
Protein, ur: NEGATIVE mg/dL
SPECIFIC GRAVITY, URINE: 1.02 (ref 1.005–1.030)

## 2016-11-16 LAB — COMPREHENSIVE METABOLIC PANEL
ALK PHOS: 61 U/L (ref 38–126)
ALT: 11 U/L — ABNORMAL LOW (ref 14–54)
ANION GAP: 9 (ref 5–15)
AST: 15 U/L (ref 15–41)
Albumin: 3.8 g/dL (ref 3.5–5.0)
BUN: 9 mg/dL (ref 6–20)
CALCIUM: 9 mg/dL (ref 8.9–10.3)
CO2: 22 mmol/L (ref 22–32)
Chloride: 103 mmol/L (ref 101–111)
Creatinine, Ser: 0.47 mg/dL (ref 0.44–1.00)
GFR calc non Af Amer: >60 mL/min (ref >60)
Glucose, Bld: 73 mg/dL (ref 65–99)
Potassium: 3.4 mmol/L — ABNORMAL LOW (ref 3.5–5.1)
SODIUM: 134 mmol/L — AB (ref 135–145)
TOTAL PROTEIN: 7.2 g/dL (ref 6.5–8.1)
Total Bilirubin: 0.7 mg/dL (ref 0.3–1.2)

## 2016-11-16 LAB — LIPASE, BLOOD: Lipase: 14 U/L (ref 11–51)

## 2016-11-16 NOTE — ED Triage Notes (Signed)
5 months Pregnant continues to have right side pain x 1 month, [facility]
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
## 2016-12-07 LAB — OB RESULTS CONSOLE GC/CHLAMYDIA: Gonorrhea: NEGATIVE

## 2016-12-26 LAB — OB RESULTS CONSOLE GC/CHLAMYDIA: Chlamydia: NEGATIVE

## 2016-12-28 NOTE — L&D Delivery Note (Signed)
Vaginal Delivery Note  21 y.o. G1P0000 at [redacted]w[redacted]d delivered a viable female infant at 23 in cephalic, compound OP position. Body cord x1. Left anterior shoulder delivered with ease. Sixty sec delayed cord clamping. Cord clamped x2 and cut. Placenta delivered spontaneously intact, with 3VC. Fundus firm on exam with massage and pitocin.  Mother: Anesthesia: Epidural Laceration: None Suture repair: N/A EBL: 150 mL  Baby: Apgars: 8, 9 Weight: Pending Cord pH: Not sent  Good hemostasis noted. Mom to postpartum.  Baby to Couplet care / Skin to Skin.  Wendee Beavers, DO, PGY-1 03/25/2017, 4:07 AM  Patient is a G1 at [redacted]w[redacted]d who was admitted with SOL/SROM, uncomplicated prenatal course other than +THC (prenatal care in [place], records rev'd).  She progressed without augmentation.  I was gloved and present for delivery in its entirety.  Second stage of labor progressed to SVD.  Mild decels during second stage noted.  Complications: none  Lacerations: none  EBL: 150cc  Cam Hai, CNM 7:24 AM  03/25/2017

## 2016-12-31 LAB — OB RESULTS CONSOLE RPR: RPR: NONREACTIVE

## 2017-02-27 LAB — OB RESULTS CONSOLE GBS: GBS: NEGATIVE

## 2017-03-25 ENCOUNTER — Encounter (HOSPITAL_COMMUNITY): Payer: Self-pay

## 2017-03-25 ENCOUNTER — Inpatient Hospital Stay (HOSPITAL_COMMUNITY): Payer: Medicaid Other | Admitting: Anesthesiology

## 2017-03-25 ENCOUNTER — Inpatient Hospital Stay (HOSPITAL_COMMUNITY)
Admission: AD | Admit: 2017-03-25 | Discharge: 2017-03-26 | DRG: 775 | Disposition: A | Discharge status: Home / Self Care | Payer: Medicaid Other | Source: Ambulatory Visit | Attending: Obstetrics & Gynecology | Admitting: Obstetrics & Gynecology

## 2017-03-25 DIAGNOSIS — O326XX0 Maternal care for compound presentation, not applicable or unspecified: Secondary | ICD-10-CM | POA: Diagnosis present

## 2017-03-25 DIAGNOSIS — Z3A39 39 weeks gestation of pregnancy: Secondary | ICD-10-CM

## 2017-03-25 DIAGNOSIS — K219 Gastro-esophageal reflux disease without esophagitis: Secondary | ICD-10-CM | POA: Diagnosis present

## 2017-03-25 DIAGNOSIS — Z3493 Encounter for supervision of normal pregnancy, unspecified, third trimester: Secondary | ICD-10-CM | POA: Diagnosis present

## 2017-03-25 DIAGNOSIS — O9962 Diseases of the digestive system complicating childbirth: Secondary | ICD-10-CM | POA: Diagnosis present

## 2017-03-25 DIAGNOSIS — O4202 Full-term premature rupture of membranes, onset of labor within 24 hours of rupture: Secondary | ICD-10-CM | POA: Diagnosis present

## 2017-03-25 DIAGNOSIS — O134 Gestational [pregnancy-induced] hypertension without significant proteinuria, complicating childbirth: Principal | ICD-10-CM | POA: Diagnosis present

## 2017-03-25 DIAGNOSIS — O139 Gestational [pregnancy-induced] hypertension without significant proteinuria, unspecified trimester: Secondary | ICD-10-CM | POA: Diagnosis present

## 2017-03-25 LAB — CBC
HCT: 31.8 % — ABNORMAL LOW (ref 36.0–46.0)
Hemoglobin: 11.5 g/dL — ABNORMAL LOW (ref 12.0–15.0)
MCH: 31.4 pg (ref 26.0–34.0)
MCHC: 36.2 g/dL — AB (ref 30.0–36.0)
MCV: 86.9 fL (ref 78.0–100.0)
PLATELETS: 287 10*3/uL (ref 150–400)
RBC: 3.66 MIL/uL — ABNORMAL LOW (ref 3.87–5.11)
RDW: 13.3 % (ref 11.5–15.5)
WBC: 11.3 10*3/uL — AB (ref 4.0–10.5)

## 2017-03-25 LAB — RAPID URINE DRUG SCREEN, HOSP PERFORMED
Amphetamines: NOT DETECTED
BARBITURATES: NOT DETECTED
BENZODIAZEPINES: NOT DETECTED
COCAINE: NOT DETECTED
OPIATES: NOT DETECTED
TETRAHYDROCANNABINOL: POSITIVE — AB

## 2017-03-25 LAB — COMPREHENSIVE METABOLIC PANEL
ALBUMIN: 3.2 g/dL — AB (ref 3.5–5.0)
ALT: 14 U/L (ref 14–54)
ANION GAP: 10 (ref 5–15)
AST: 19 U/L (ref 15–41)
Alkaline Phosphatase: 147 U/L — ABNORMAL HIGH (ref 38–126)
BUN: 8 mg/dL (ref 6–20)
CHLORIDE: 106 mmol/L (ref 101–111)
CO2: 19 mmol/L — AB (ref 22–32)
Calcium: 9.1 mg/dL (ref 8.9–10.3)
Creatinine, Ser: 0.69 mg/dL (ref 0.44–1.00)
GFR calc non Af Amer: >60 mL/min (ref >60)
GLUCOSE: 84 mg/dL (ref 65–99)
POTASSIUM: 4 mmol/L (ref 3.5–5.1)
SODIUM: 135 mmol/L (ref 135–145)
Total Bilirubin: 0.4 mg/dL (ref 0.3–1.2)
Total Protein: 6.7 g/dL (ref 6.5–8.1)

## 2017-03-25 LAB — ABO/RH: ABO/RH(D): A POS

## 2017-03-25 LAB — PROTEIN / CREATININE RATIO, URINE
Creatinine, Urine: 49 mg/dL
PROTEIN CREATININE RATIO: 0.55 mg/mg{creat} — AB (ref 0.00–0.15)
TOTAL PROTEIN, URINE: 27 mg/dL

## 2017-03-25 LAB — TYPE AND SCREEN
ABO/RH(D): A POS
ANTIBODY SCREEN: NEGATIVE

## 2017-03-25 MED ORDER — PHENYLEPHRINE 40 MCG/ML (10ML) SYRINGE FOR IV PUSH (FOR BLOOD PRESSURE SUPPORT)
80.0000 ug | PREFILLED_SYRINGE | INTRAVENOUS | Status: DC | PRN
  Filled 2017-03-25: qty 5
  Filled 2017-03-25: qty 10

## 2017-03-25 MED ORDER — FENTANYL 2.5 MCG/ML BUPIVACAINE 1/10 % EPIDURAL INFUSION (WH - ANES)
14.0000 mL/h | INTRAMUSCULAR | Status: DC | PRN
  Administered 2017-03-25: 14 mL/h via EPIDURAL
  Filled 2017-03-25: qty 100

## 2017-03-25 MED ORDER — DIPHENHYDRAMINE HCL 50 MG/ML IJ SOLN: 12.5000 mg | INTRAMUSCULAR | Status: DC | PRN

## 2017-03-25 MED ORDER — BENZOCAINE-MENTHOL 20-0.5 % EX AERO
1.0000 "application " | INHALATION_SPRAY | CUTANEOUS | Status: DC | PRN
  Filled 2017-03-25: qty 56

## 2017-03-25 MED ORDER — LACTATED RINGERS IV SOLN
INTRAVENOUS | Status: DC
  Administered 2017-03-25: 03:00:00 via INTRAVENOUS

## 2017-03-25 MED ORDER — OXYCODONE-ACETAMINOPHEN 5-325 MG PO TABS: 2.0000 | ORAL_TABLET | ORAL | Status: DC | PRN

## 2017-03-25 MED ORDER — OXYTOCIN BOLUS FROM INFUSION
500.0000 mL | Freq: Once | INTRAVENOUS | Status: AC
  Administered 2017-03-25: 500 mL via INTRAVENOUS

## 2017-03-25 MED ORDER — MEASLES, MUMPS & RUBELLA VAC ~~LOC~~ INJ
0.5000 mL | INJECTION | Freq: Once | SUBCUTANEOUS | Status: AC
  Administered 2017-03-26: 0.5 mL via SUBCUTANEOUS
  Filled 2017-03-25: qty 0.5

## 2017-03-25 MED ORDER — PRENATAL MULTIVITAMIN CH
1.0000 | ORAL_TABLET | Freq: Every day | ORAL | Status: DC
  Administered 2017-03-25 – 2017-03-26 (×2): 1 via ORAL
  Filled 2017-03-25 (×2): qty 1

## 2017-03-25 MED ORDER — ONDANSETRON HCL 4 MG/2ML IJ SOLN: 4.0000 mg | INTRAMUSCULAR | Status: DC | PRN

## 2017-03-25 MED ORDER — LACTATED RINGERS IV SOLN
500.0000 mL | Freq: Once | INTRAVENOUS | Status: AC
  Administered 2017-03-25: 500 mL via INTRAVENOUS

## 2017-03-25 MED ORDER — DIBUCAINE 1 % RE OINT: 1.0000 "application " | TOPICAL_OINTMENT | RECTAL | Status: DC | PRN

## 2017-03-25 MED ORDER — COCONUT OIL OIL: 1.0000 "application " | TOPICAL_OIL | Status: DC | PRN

## 2017-03-25 MED ORDER — LIDOCAINE HCL (PF) 1 % IJ SOLN
INTRAMUSCULAR | Status: DC | PRN
  Administered 2017-03-25 (×2): 4 mL via EPIDURAL

## 2017-03-25 MED ORDER — ACETAMINOPHEN 325 MG PO TABS: 650.0000 mg | ORAL_TABLET | ORAL | Status: DC | PRN

## 2017-03-25 MED ORDER — SOD CITRATE-CITRIC ACID 500-334 MG/5ML PO SOLN: 30.0000 mL | ORAL | Status: DC | PRN

## 2017-03-25 MED ORDER — EPHEDRINE 5 MG/ML INJ
10.0000 mg | INTRAVENOUS | Status: DC | PRN
  Filled 2017-03-25: qty 2

## 2017-03-25 MED ORDER — SENNOSIDES-DOCUSATE SODIUM 8.6-50 MG PO TABS
2.0000 | ORAL_TABLET | ORAL | Status: DC
  Administered 2017-03-25: 2 via ORAL
  Filled 2017-03-25: qty 2

## 2017-03-25 MED ORDER — ONDANSETRON HCL 4 MG PO TABS
4.0000 mg | ORAL_TABLET | ORAL | Status: DC | PRN
  Administered 2017-03-25: 4 mg via ORAL
  Filled 2017-03-25: qty 1

## 2017-03-25 MED ORDER — TETANUS-DIPHTH-ACELL PERTUSSIS 5-2.5-18.5 LF-MCG/0.5 IM SUSP
0.5000 mL | Freq: Once | INTRAMUSCULAR | Status: AC
  Administered 2017-03-26: 0.5 mL via INTRAMUSCULAR

## 2017-03-25 MED ORDER — OXYCODONE-ACETAMINOPHEN 5-325 MG PO TABS: 1.0000 | ORAL_TABLET | ORAL | Status: DC | PRN

## 2017-03-25 MED ORDER — LACTATED RINGERS IV SOLN: 500.0000 mL | INTRAVENOUS | Status: DC | PRN

## 2017-03-25 MED ORDER — DIPHENHYDRAMINE HCL 25 MG PO CAPS: 25.0000 mg | ORAL_CAPSULE | Freq: Four times a day (QID) | ORAL | Status: DC | PRN

## 2017-03-25 MED ORDER — FLEET ENEMA 7-19 GM/118ML RE ENEM: 1.0000 | ENEMA | RECTAL | Status: DC | PRN

## 2017-03-25 MED ORDER — WITCH HAZEL-GLYCERIN EX PADS: 1.0000 "application " | MEDICATED_PAD | CUTANEOUS | Status: DC | PRN

## 2017-03-25 MED ORDER — IBUPROFEN 600 MG PO TABS
600.0000 mg | ORAL_TABLET | Freq: Four times a day (QID) | ORAL | Status: DC
  Administered 2017-03-25 – 2017-03-26 (×6): 600 mg via ORAL
  Filled 2017-03-25 (×6): qty 1

## 2017-03-25 MED ORDER — PHENYLEPHRINE 40 MCG/ML (10ML) SYRINGE FOR IV PUSH (FOR BLOOD PRESSURE SUPPORT)
80.0000 ug | PREFILLED_SYRINGE | INTRAVENOUS | Status: DC | PRN
  Administered 2017-03-25 (×2): 80 ug via INTRAVENOUS
  Filled 2017-03-25: qty 5

## 2017-03-25 MED ORDER — SIMETHICONE 80 MG PO CHEW
80.0000 mg | CHEWABLE_TABLET | ORAL | Status: DC | PRN
Start: 2017-03-25 — End: 2017-03-26
  Administered 2017-03-26: 80 mg via ORAL

## 2017-03-25 MED ORDER — OXYTOCIN 40 UNITS IN LACTATED RINGERS INFUSION - SIMPLE MED
2.5000 [IU]/h | INTRAVENOUS | Status: DC
  Filled 2017-03-25: qty 1000

## 2017-03-25 MED ORDER — LIDOCAINE HCL (PF) 1 % IJ SOLN
30.0000 mL | INTRAMUSCULAR | Status: DC | PRN
  Filled 2017-03-25: qty 30

## 2017-03-25 MED ORDER — ZOLPIDEM TARTRATE 5 MG PO TABS: 5.0000 mg | ORAL_TABLET | Freq: Every evening | ORAL | Status: DC | PRN

## 2017-03-25 MED ORDER — EPHEDRINE 5 MG/ML INJ
10.0000 mg | INTRAVENOUS | Status: DC | PRN
Start: 2017-03-25 — End: 2017-03-25
  Filled 2017-03-25: qty 2

## 2017-03-25 MED ORDER — ONDANSETRON HCL 4 MG/2ML IJ SOLN: 4.0000 mg | Freq: Four times a day (QID) | INTRAMUSCULAR | Status: DC | PRN

## 2017-03-25 NOTE — H&P (Signed)
LABOR AND DELIVERY ADMISSION HISTORY AND PHYSICAL NOTE  Donna Reeves is a 21 y.o. female G1P0000 with IUP at [redacted]w[redacted]d by 10 wk [place] presenting for SOL. She reports spontaneous rupture of membrane with clear leakage of fluid having an onset of contractions with worsening frequency and intensity since midnight. She denies vaginal bleeding and reports positive fetal movement. Prenatal care was performed in [place]. This is her first pregnancy.  Prenatal History/Complications: Chlamydia during pregnancy (treated, TOC 12/09/16) THC positive during pregnancy first trimester  Past Medical History: Past Medical History:  Diagnosis Date  . Degenerative disc disease   . Herniated disc     Past Surgical History: History reviewed. No pertinent surgical history.  Obstetrical History: OB History    Gravida Para Term Preterm AB Living   1 0 0 0 0 0   SAB TAB Ectopic Multiple Live Births   0 0 0 0        Social History: Social History   Social History  . Marital status: Single    Spouse name: N/A  . Number of children: N/A  . Years of education: N/A   Social History Main Topics  . Smoking status: Never Smoker  . Smokeless tobacco: Never Used  . Alcohol use No  . Drug use: No  . Sexual activity: Not Asked   Other Topics Concern  . None   Social History Narrative  . None    Family History: History reviewed. No pertinent family history.  Allergies: Allergies  Allergen Reactions  . Ultram [Tramadol] Nausea Only and Other (See Comments)    headache  . Augmentin [Amoxicillin-Pot Clavulanate] Other (See Comments)    Unknown.    Prescriptions Prior to Admission  Medication Sig Dispense Refill Last Dose  . ondansetron (ZOFRAN ODT) 4 MG disintegrating tablet 4mg  ODT q4 hours prn nausea/vomit (Patient taking differently: Take 4 mg by mouth every 8 (eight) hours as needed for nausea or vomiting. ) 10 tablet 0 unknown  . Prenatal Vit-Fe Fumarate-FA (GNP PRENATAL VITAMINS) 28-0.8 MG  TABS Take 1 tablet by mouth daily. (Patient taking differently: Take 1 tablet by mouth daily. Gummies) 30 tablet 1 11/15/2016 at Unknown time     Review of Systems   All systems reviewed and negative except as stated in HPI  Blood pressure 125/75, pulse 75, temperature 97.5 F (36.4 C), temperature source Oral, resp. rate 18, last menstrual period 07/01/2016. General appearance: alert, cooperative and moderate distress Lungs: clear to auscultation bilaterally Heart: regular rate and rhythm Abdomen: soft, non-tender; bowel sounds normal Extremities: No calf swelling or tenderness Presentation: cephalic Fetal monitoring: Baseline 110 bpm, moderate variability, accelerations present, decelerations Uterine activity: Regular Q3 mins Dilation: 4 Effacement (%): 90 Station: -1 Exam by:: Kayren Eaves RN   Prenatal labs: ABO, Rh: A positive Antibody: Negative Rubella: Nonimmune RPR: Nonreactive HBsAg: Nonreactive HIV: Nonreactive GBS: Negative 1 hr Glucola: Normal Genetic screening:  Normal Anatomy US: Normal  Prenatal Transfer Tool  Maternal Diabetes: No Genetic Screening: Normal Maternal Ultrasounds/Referrals: Normal Fetal Ultrasounds or other Referrals:  None Maternal Substance Abuse:  Yes:  Type: Marijuana Significant Maternal Medications:  None Significant Maternal Lab Results: Lab values include: Group B Strep negative  No results found for this or any previous visit (from the past 24 hour(s)).  Patient Active Problem List   Diagnosis Date Noted  . Normal labor 03/25/2017  . Low back pain 03/28/2013    Assessment: Donna Reeves is a 21 y.o. G1P0000 at [redacted]w[redacted]d here for SOL. SROM  at midnight prior to arrival. Asking for epidural.  #Labor:SOL #Pain: Epidural #FWB: Category I #ID:  GBS negative #MOF: Breast #MOC:Depo #Circ:  Outpatient  Wendee Beaversavid J McMullen, DO, PGY-1 03/25/2017, 2:26 AM   CNM attestation:  I have seen and examined this patient; I agree  with above documentation in the resident's note.   Donna Reeves Scholler is a 21 y.o. G1P1001 here for SOL/SROM  PE: BP (!) 157/99 (BP Location: Left Arm)   Pulse 75   Temp 97.6 F (36.4 C) (Oral)   Resp 18   Ht 5\' 1"  (1.549 m)   Wt 57.6 kg (127 lb)   LMP 07/01/2016   SpO2 100%   Breastfeeding? Unknown   BMI 24.00 kg/m  Gen: calm comfortable, NAD Resp: normal effort, no distress Abd: gravid  ROS, labs, PMH reviewed  [facility]: [facility] Prenatal records rev'd Anticipate SVD  Cam HaiSHAW, Meliyah Simon CNM 03/25/2017, 7:19 AM

## 2017-03-25 NOTE — Anesthesia Preprocedure Evaluation (Signed)
Anesthesia Evaluation  Patient identified by MRN, date of birth, ID band Patient awake    Reviewed: Allergy & Precautions, NPO status , Patient's Chart, lab work & pertinent test results  Airway Mallampati: II  TM Distance: >3 FB Neck ROM: Full    Dental no notable dental hx. (+) Teeth Intact   Pulmonary neg pulmonary ROS,    Pulmonary exam normal breath sounds clear to auscultation       Cardiovascular negative cardio ROS Normal cardiovascular exam Rhythm:Regular Rate:Normal     Neuro/Psych negative neurological ROS  negative psych ROS   GI/Hepatic Neg liver ROS, GERD  ,  Endo/Other  negative endocrine ROS  Renal/GU negative Renal ROS  negative genitourinary   Musculoskeletal  (+) Arthritis ,   Abdominal   Peds  Hematology  (+) anemia ,   Anesthesia Other Findings   Reproductive/Obstetrics (+) Pregnancy                             Anesthesia Physical Anesthesia Plan  ASA: II  Anesthesia Plan: Epidural   Post-op Pain Management:    Induction:   Airway Management Planned: Natural Airway  Additional Equipment:   Intra-op Plan:   Post-operative Plan:   Informed Consent: I have reviewed the patients History and Physical, chart, labs and discussed the procedure including the risks, benefits and alternatives for the proposed anesthesia with the patient or authorized representative who has indicated his/her understanding and acceptance.     Plan Discussed with: Anesthesiologist  Anesthesia Plan Comments:         Anesthesia Quick Evaluation

## 2017-03-25 NOTE — Lactation Note (Signed)
This note was copied from a baby's chart. Lactation Consultation Note  Patient Name: Donna Reeves Today's Date: 03/25/2017 Reason for consult: Initial assessment Attempted to assist Mom with latching baby since baby awake. Baby would not latch at this time, drops of colostrum dripped in baby's mouth with hand expression. Mom reports baby just spit up small amount of mucous [facility]. Basic teaching reviewed with Mom. Encouraged to BF with feeding ques. Lactation brochure left for review, advised of OP services and support group. Encouraged to call for assist with feedings. Left baby STS on Mom.   Maternal Data Has patient been taught Hand Expression?: Yes Does the patient have breastfeeding experience prior to this delivery?: No  Feeding Feeding Type: Breast Fed Length of feed: 0 min  LATCH Score/Interventions Latch: Too sleepy or reluctant, no latch achieved, no sucking elicited.                    Lactation Tools Discussed/Used WIC Program: Yes   Consult Status Consult Status: Follow-up Date: 03/26/17 Follow-up type: In-patient    Alfred LevinsGranger, Silveria Botz Ann 03/25/2017, 11:05 AM

## 2017-03-25 NOTE — MAU Note (Signed)
Pt reports SROM at 0000-clear. Contractions. +FM

## 2017-03-25 NOTE — Anesthesia Procedure Notes (Signed)
Epidural Patient location during procedure: OB Start time: 03/25/2017 2:52 AM  Staffing Anesthesiologist: Mal AmabileFOSTER, Fenix Rorke Performed: anesthesiologist   Preanesthetic Checklist Completed: patient identified, site marked, surgical consent, pre-op evaluation, timeout performed, IV checked, risks and benefits discussed and monitors and equipment checked  Epidural Patient position: sitting Prep: site prepped and draped and DuraPrep Patient monitoring: continuous pulse ox and blood pressure Approach: midline Location: L3-L4 Injection technique: LOR air  Needle:  Needle type: Tuohy  Needle gauge: 17 G Needle length: 9 cm and 9 Needle insertion depth: 4 cm Catheter type: closed end flexible Catheter size: 19 Gauge Catheter at skin depth: 9 cm Test dose: negative and Other  Assessment Events: blood not aspirated, injection not painful, no injection resistance, negative IV test and no paresthesia  Additional Notes Patient identified. Risks and benefits discussed including failed block, incomplete  Pain control, post dural puncture headache, nerve damage, paralysis, blood pressure Changes, nausea, vomiting, reactions to medications-both toxic and allergic and post Partum back pain. All questions were answered. Patient expressed understanding and wished to proceed. Sterile technique was used throughout procedure. Epidural site was Dressed with sterile barrier dressing. No paresthesias, signs of intravascular injection Or signs of intrathecal spread were encountered.  Patient was more comfortable after the epidural was dosed. Please see RN's note for documentation of vital signs and FHR which are stable.

## 2017-03-25 NOTE — Anesthesia Postprocedure Evaluation (Signed)
Anesthesia Post Note  Patient: Donna Reeves  Procedure(s) Performed: * No procedures listed *  Patient location during evaluation: Mother Baby Anesthesia Type: Epidural Level of consciousness: awake and alert Pain management: satisfactory to patient Vital Signs Assessment: post-procedure vital signs reviewed and stable Respiratory status: respiratory function stable Cardiovascular status: stable Postop Assessment: no headache, no backache, epidural receding, patient able to bend at knees, no signs of nausea or vomiting and adequate PO intake Anesthetic complications: no        Last Vitals:  Vitals:   03/25/17 0525 03/25/17 0700  BP: (!) 157/99 139/75  Pulse: 75 63  Resp: 18 16  Temp: 36.4 C 37 C    Last Pain:  Vitals:   03/25/17 0700  TempSrc: Oral  PainSc:    Pain Goal:                 Donna Reeves

## 2017-03-26 DIAGNOSIS — O139 Gestational [pregnancy-induced] hypertension without significant proteinuria, unspecified trimester: Secondary | ICD-10-CM | POA: Diagnosis present

## 2017-03-26 LAB — RPR: RPR: NONREACTIVE

## 2017-03-26 NOTE — Lactation Note (Signed)
This note was copied from a baby's chart. Lactation Consultation Note  Patient Name: Donna Reeves Today's Date: 03/26/2017 Reason for consult: Follow-up assessment;Infant < 6lbs Baby is now 79 hours old.  He had a slow start with feeding yesterday but has begun to latch and feed well today.  Baby currently sleepy.  Mom recently pumped and hand expressed 5 mls of colostrum. Attempted cup feeding baby but baby would not extend tongue.  Baby given colostrum using a slow flow nipple. It did take several minutes to elicit sucking.  Instructed mom to put baby to breast with any feeding cue and call for help as needed.  Instructed to continue to hand express/pump every 3 hours and give baby any expressed milk by cup or bottle.  Maternal Data    Feeding Feeding Type: Breast Fed Length of feed: 45 min  LATCH Score/Interventions Latch: Too sleepy or reluctant, no latch achieved, no sucking elicited. Intervention(s): Skin to skin;Teach feeding cues;Waking techniques Intervention(s): Breast compression;Breast massage;Assist with latch;Adjust position  Audible Swallowing: None Intervention(s): Hand expression  Type of Nipple: Everted at rest and after stimulation  Comfort (Breast/Nipple): Soft / non-tender     Hold (Positioning): Assistance needed to correctly position infant at breast and maintain latch. Intervention(s): Breastfeeding basics reviewed;Support Pillows;Position options;Skin to skin  LATCH Score: 5  Lactation Tools Discussed/Used Tools: Bottle;Feeding cup Breast pump type: Double-Electric Breast Pump   Consult Status Consult Status: Follow-up Date: 03/27/17 Follow-up type: In-patient    Donna Reeves 03/26/2017, 2:23 PM

## 2017-03-26 NOTE — Lactation Note (Addendum)
This note was copied from a baby's chart. Lactation Consultation Note Baby had 7% weight loss at 20 hrs old. Baby was re-weighed. Hasn't been interested in BF. Out put is 7 voids, 6 stools, and 1 emesis. Increased output is probable cause for weight loss.  Stressed importance of I&O w/mom.  Taught mom hand expression. Discussed supplementing baby after BF w/colostrum for now. Hand expressed 5 ml.  In football position attempted to latch baby. Had no interested, wouldn't even open wide when cried. Mom done STS during attempted feeding.  Taught spoon feeding, baby took 5 ml's well.  Mom encouraged to feed baby 8-12 times/24 hours and with feeding cues. Mom encouraged to waken baby for feeds.  Mom shown how to use DEBP & how to disassemble, clean, & reassemble parts. Mom knows to pump q3h for 15-20 min.  Newborn behavior and feeding habits reviewed. Discussed breast massage during feedings. WH/LC brochure given w/resources, support groups and LC services.  Patient Name: Donna Reeves [id number] Date: 03/26/2017 Reason for consult: Follow-up assessment;Infant weight loss;Infant < 6lbs   Maternal Data    Feeding Feeding Type: Breast Milk Length of feed: 0 min  LATCH Score/Interventions Latch: Too sleepy or reluctant, no latch achieved, no sucking elicited. Intervention(s): Skin to skin;Teach feeding cues;Waking techniques Intervention(s): Adjust position;Assist with latch;Breast massage;Breast compression  Audible Swallowing: None Intervention(s): Skin to skin;Hand expression Intervention(s): Alternate breast massage  Type of Nipple: Everted at rest and after stimulation  Comfort (Breast/Nipple): Soft / non-tender     Hold (Positioning): Full assist, staff holds infant at breast Intervention(s): Breastfeeding basics reviewed;Support Pillows;Position options;Skin to skin  LATCH Score: 4  Lactation Tools Discussed/Used Tools: Pump Breast pump type: Double-Electric Breast  Pump WIC Program: Yes Pump Review: Setup, frequency, and cleaning;Milk Storage Initiated by:: Peri Jefferson RN IBCLC Date initiated:: 03/26/17   Consult Status Consult Status: Follow-up Date: 03/26/17 Follow-up type: In-patient    Donna Reeves, [name] 03/26/2017, 2:17 AM

## 2017-03-26 NOTE — Progress Notes (Signed)
CSW made a 2nd attempt to contact [place] CPS via telephoneto make a report for infant's positive UDS for THC.  CSW has requested a call back from CPS.  If CSW is unable to make a report today, CSW will have the weekend CSW to follow-up with CPS.    There are no barriers to d/c for the family.   Keene Gilkey Boyd-Gilyard, MSW, LCSW Clinical Social Work [phone]  

## 2017-03-26 NOTE — Progress Notes (Signed)
CSW left a voicemail message regarding making a CPS report for a positive UDS for [facility] CPS.  CSW requested a telephone call back.  Blaine Hamper, MSW, LCSW Clinical Social Work [phone]

## 2017-03-26 NOTE — Discharge Summary (Signed)
OB Discharge Summary     Patient Name: Donna Reeves DOB: December 16, 1996 MRN: [id number]  Date of admission: 03/25/2017 Delivering MD: Wendee Beavers   Date of discharge: 03/26/2017  Admitting diagnosis: 39 WEEKS CTX ROM Intrauterine pregnancy: [redacted]w[redacted]d     Secondary diagnosis:  Active Problems:   Normal labor   Gestational hypertension  Additional problems: Gestational HTN     Discharge diagnosis: Term Pregnancy Delivered and Gestational Hypertension                                                                                                Post partum procedures:None  Augmentation: None  Complications: None  Hospital course:  Onset of Labor With Vaginal Delivery     21 y.o. yo G1P1001 at [redacted]w[redacted]d was admitted in Active Labor on 03/25/2017. Patient had an uncomplicated labor course as follows:  Membrane Rupture Time/Date: 12:00 AM ,03/25/2017   Intrapartum Procedures: Episiotomy: None [1]                                         Lacerations:    None Patient had a delivery of a Viable infant. 03/25/2017  Information for the patient's newborn:  Donna Reeves [[id number]]  Delivery Method: Vaginal, Spontaneous Delivery (Filed from Delivery Summary)    Pateint had an uncomplicated postpartum course.  She is ambulating, tolerating a regular diet, passing flatus, and urinating well. Patient is discharged home in stable condition on 03/26/17.   Physical exam  Vitals:   03/25/17 1945 03/26/17 0000 03/26/17 0530 03/26/17 0800  BP: 135/88 132/86 126/81 119/72  Pulse: 67 68 63 60  Resp:   18 16  Temp:   98.1 F (36.7 C) 98.9 F (37.2 C)  TempSrc:   Oral Oral  SpO2:   96% 98%  Weight:      Height:       General: alert, cooperative and no distress Lochia: appropriate Uterine Fundus: firm Incision: N/A DVT Evaluation: No evidence of DVT seen on physical exam. Labs: Lab Results  Component Value Date   WBC 11.3 (H) 03/25/2017   HGB 11.5 (L) 03/25/2017   HCT 31.8 (L)  03/25/2017   MCV 86.9 03/25/2017   PLT 287 03/25/2017   CMP Latest Ref Rng & Units 03/25/2017  Glucose 65 - 99 mg/dL 84  BUN 6 - 20 mg/dL 8  Creatinine 8.65 - 7.84 mg/dL 6.96  Sodium 295 - 284 mmol/L 135  Potassium 3.5 - 5.1 mmol/L 4.0  Chloride 101 - 111 mmol/L 106  CO2 22 - 32 mmol/L 19(L)  Calcium 8.9 - 10.3 mg/dL 9.1  Total Protein 6.5 - 8.1 g/dL 6.7  Total Bilirubin 0.3 - 1.2 mg/dL 0.4  Alkaline Phos 38 - 126 U/L 147(H)  AST 15 - 41 U/L 19  ALT 14 - 54 U/L 14    Discharge instruction: per After Visit Summary and "Baby and Me Booklet".  After visit meds:  Allergies as of 03/26/2017      Reactions   Ultram [tramadol]  Nausea Only, Other (See Comments)   headache   Augmentin [amoxicillin-pot Clavulanate] Other (See Comments)   Unknown.      Medication List    TAKE these medications   GNP PRENATAL VITAMINS 28-0.8 MG Tabs Take 1 tablet by mouth daily. What changed:  additional instructions   ondansetron 4 MG disintegrating tablet Commonly known as:  ZOFRAN ODT  ODT q4 hours prn nausea/vomit What changed:  how much to take  how to take this  when to take this  reasons to take this  additional instructions       Diet: low salt diet  Activity: Advance as tolerated. Pelvic rest for 6 weeks.   Outpatient follow up:1 week for blood pressure check Follow up Appt:No future appointments. Follow up Visit:No Follow-up on file.  Postpartum contraception: Depo Provera  Newborn Data: Live born female  Birth Weight: 6 lb 4.9 oz (2860 g) APGAR: 8, 9  Baby Feeding: Breast Disposition:home with mother   03/26/2017 [name], MD   CNM Note:  Patient ambulating in the room; vital signs stable at this time. Denies dizziness, blurry vision, HA or epigastric pain. Patient will follow up with her provider on 03-29-2017 for a blood pressure check [facility] in [place], [place]).  I confirm that I have verified the information documented in the resident's  note and that I have also personally reperformed the physical exam and all medical decision making activities. Luna Kitchens CNM

## 2017-03-26 NOTE — Clinical Social Work Maternal (Signed)
  CLINICAL SOCIAL WORK MATERNAL/CHILD NOTE  Patient Details  Name: Donna Reeves MRN: [id number] Date of Birth: 10/29/1996  Date:  03/26/2017  Clinical Social Worker Initiating Note:  Jaiyla Granados Boyd-Gilyard Date/ Time Initiated:  03/26/17/1227     Child's Name:  Donna Reeves   Legal Guardian:  Mother (FOB Romeo Reeves 05/20/1994)   Need for Interpreter:  None   Date of Referral:  03/26/17     Reason for Referral:  Current Substance Use/Substance Use During Pregnancy  (hx of THC use during pregnancy. )   Referral Source:  Central Nursery   Address:  [address] [place] [address]  Phone number:  [phone]   Household Members:  Siblings, Self (MOB resides with MOB's grandmother. )   Natural Supports (not living in the home):  Spouse/significant other   Professional Supports: None   Employment: Unemployed   Type of Work:     Education:  High school graduate   Financial Resources:  Medicaid   Other Resources:  WIC   Cultural/Religious Considerations Which May Impact Care:  None Reported  Strengths:  Ability to meet basic needs , Home prepared for child    Risk Factors/Current Problems:  Substance Use    Cognitive State:  Able to Concentrate , Alert , Linear Thinking    Mood/Affect:  Bright , Interested , Comfortable    CSW Assessment: CSW met with MOB for a consult of hx of THC use during pregnancy.  MOB was polite, easily engaged, and receptive.  MOB gave CSW permission to meet with MOB while FOB was present. CSW inquired about MOB supports and MOB communicated that the FOB and MOB's immediate and extended family are supportive. CSW inquired about MOB's marijuana use and MOB was not forthcoming about MOB marijuana use.  MOB reported MOB's last use of marijuana was about 3 months ago.  CSW made MOB aware that the infant's UDS would not be positive if MOB's last use was 3 months ago.  MOB stated that MOB last used marijuana in January 2018. CSW informed MOB of  the hospital's drug screen policy, and informed MOB of the two screenings for the infant. MOB was understanding and again admitted to utilizing marijuana during pregnancy. CSW thanked MOB for being honest, and informed MOB that the infant had a positive UDS CSW is going to make a report with [place] CPS. CSW also made MOB aware that CSW will monitor infant's CDS and will report results to CPS as well. MOB and FOB was understanding and did not have any questions or concerns. CSW offered resources and referrals for SA treatment and MOB declined. CSW educated MOB about PPD. CSW informed MOB of possible supports and interventions to decrease PPD.  CSW also encouraged MOB to seek medical attention if needed for increased signs and symptoms for PPD. CSW reviewed SIDS; MOB was knowledgeable and asked appropriate questions.  MOB communicated that she has a crib for the baby, and feels prepared for the infant.  MOB did not have any further questions, concerns, or needs at this time.  CSW thanked MOB and FOB for meeting with CSW and provided the parents with CSW's contact information.   CSW Plan/Description:  Information/Referral to Community Resources , Patient/Family Education , Child Protective Service Report    Nargis Abrams Boyd-Gilyard, MSW, LCSW Clinical Social Work [phone]   Trinty Marken D BOYD-GILYARD, LCSW 03/26/2017, 12:31 PM  

## 2017-03-27 ENCOUNTER — Ambulatory Visit: Payer: Self-pay

## 2017-03-27 NOTE — Progress Notes (Signed)
CPS report made to [place] on call social worker Simrit Pulliam. At this time, there are no barriers to d/c.   Randen Kauth, MSW, LCSW-A Clinical Social Worker  [facility]  Office: [phone]  

## 2017-03-27 NOTE — Lactation Note (Signed)
This note was copied from a baby's chart. Lactation Consultation Note; Infant is at 10 % weight loss. Infant has had 6 wet diapers and 4 stools. Mother states that the last stool was green.  Mother just finished feeding infant . She breastfed infant and then she offered a bottle with Alemintum. Mother states that infant refused the bottle.  Mother described slight pain with pumping.  Assist mother with post pumping breast. Mother fit with the #24 flange.  Mother breast are flowing with colostrum . Her breast are feeling heavy. Mother advised to breastfeed infant well and do good breast massage while feeding. Mother to continue to breastfeed and supplement infant with EBM/ formula. Mother advised to massage and place ice on swollen breast for 15 mins every 3-4 hours.   Mother advised to page for lactation assistance when she finishes pumping. Mother very receptive to teaching.   Patient Name: Donna Reeves [id number] Date: 03/27/2017 Reason for consult: Follow-up assessment   Maternal Data    Feeding Feeding Type: Breast Fed Length of feed: 20 min  LATCH Score/Interventions                      Lactation Tools Discussed/Used     Consult Status      Donna Reeves 03/27/2017, 10:05 AM

## 2017-03-27 NOTE — Lactation Note (Signed)
This note was copied from a baby's chart. Lactation Consultation Note: Mother reports that infant breastfed and then took 8 ml ebm.  She didn't page me to see the feeding . Infant sleepy, attempt to rouse infant with baby sit ups. Infant latched on for only a few sucks. Mother advised to page me for next feeding. Mother advised to attempt to get infant awake, supplement infant giving ebm and using a bottle if infant will take feeding. Discussed I/O with mother . Advised to keep accurate account of all wet and dirties. Reviewed supplemental guidelines with Mother. Mother receptive to all teaching. Mother is aware of all available LC services and community support services.   Patient Name: Donna Reeves [id number] Date: 03/27/2017 Reason for consult: Follow-up assessment   Maternal Data    Feeding Feeding Type: Breast Fed Length of feed: 5 min (shallow latch, requires stimulation to get baby to suck)  LATCH Score/Interventions Latch: Repeated attempts needed to sustain latch, nipple held in mouth throughout feeding, stimulation needed to elicit sucking reflex. Intervention(s): Skin to skin;Teach feeding cues;Waking techniques Intervention(s): Assist with latch;Breast compression;Breast massage;Adjust position  Audible Swallowing: A few with stimulation Intervention(s): Alternate breast massage  Type of Nipple: Everted at rest and after stimulation  Comfort (Breast/Nipple): Soft / non-tender (mother has milk flow)     Hold (Positioning): Full assist, staff holds infant at breast  LATCH Score: 6  Lactation Tools Discussed/Used Tools:  (curved tip syringe used at breast when latched) WIC Program: No   Consult Status      Donna Reeves 03/27/2017, 3:13 PM

## 2017-03-27 NOTE — Lactation Note (Signed)
This note was copied from a baby's chart. Lactation Consultation Note  Patient Name: Donna Reeves Today's Date: 03/27/2017 Reason for consult: Follow-up assessment;Infant < 6lbs;Infant weight loss   Follow up with mom prior to d/c home. Mom had just finished feeding infant for 10 minutes and infant spit up a small amount yellow emesis. Mom has bottle of EBM 30 cc at bedside. Infant weight increased today. Enc mom to offer EBM via bottle after BF. Mom changed infant diaper and stool noted to be transitional. Infant would not latch back to breast.   Mom has a manual pump and is getting an electric pump from a friend.   Mom without further questions/concerns at this time. Infant with follow up ped appt Monday.   Enc mom to call back with further questions/concerns.     Maternal Data Formula Feeding for Exclusion: No Has patient been taught Hand Expression?: Yes Does the patient have breastfeeding experience prior to this delivery?: No  Feeding Feeding Type: Breast Fed Length of feed: 10 min  LATCH Score/Interventions Latch: Repeated attempts needed to sustain latch, nipple held in mouth throughout feeding, stimulation needed to elicit sucking reflex. Intervention(s): Skin to skin;Teach feeding cues;Waking techniques Intervention(s): Assist with latch;Breast compression;Breast massage;Adjust position  Audible Swallowing: A few with stimulation Intervention(s): Alternate breast massage  Type of Nipple: Everted at rest and after stimulation  Comfort (Breast/Nipple): Soft / non-tender (mother has milk flow)     Hold (Positioning): Full assist, staff holds infant at breast  LATCH Score: 6  Lactation Tools Discussed/Used Tools:  (curved tip syringe used at breast when latched) WIC Program: No   Consult Status Consult Status: Complete Follow-up type: Call as needed    Ed Blalock 03/27/2017, 4:50 PM

## 2017-03-30 NOTE — Progress Notes (Signed)
CSW received results for baby's CDS. CDS was (+) for cocaine and benzos. CSW provided additional results to [facility] Social [facility] social worker [place].   Donna Reeves, MSW, LCSW-A Clinical Social Worker  [place] [facility]  Office: [phone]

## 2017-04-05 ENCOUNTER — Emergency Department (HOSPITAL_COMMUNITY)
Admission: EM | Admit: 2017-04-05 | Discharge: 2017-04-05 | Disposition: A | Discharge status: Home / Self Care | Payer: Medicaid Other | Attending: Emergency Medicine | Admitting: Emergency Medicine

## 2017-04-05 ENCOUNTER — Emergency Department (HOSPITAL_COMMUNITY): Payer: Medicaid Other

## 2017-04-05 ENCOUNTER — Encounter (HOSPITAL_COMMUNITY): Payer: Self-pay | Admitting: *Deleted

## 2017-04-05 DIAGNOSIS — O9953 Diseases of the respiratory system complicating the puerperium: Secondary | ICD-10-CM | POA: Diagnosis present

## 2017-04-05 DIAGNOSIS — R300 Dysuria: Secondary | ICD-10-CM | POA: Diagnosis not present

## 2017-04-05 DIAGNOSIS — J029 Acute pharyngitis, unspecified: Secondary | ICD-10-CM | POA: Insufficient documentation

## 2017-04-05 LAB — CBC WITH DIFFERENTIAL/PLATELET
BASOS ABS: 0 10*3/uL (ref 0.0–0.1)
Basophils Relative: 1 %
EOS PCT: 2 %
Eosinophils Absolute: 0.1 10*3/uL (ref 0.0–0.7)
HCT: 34.5 % — ABNORMAL LOW (ref 36.0–46.0)
Hemoglobin: 12.4 g/dL (ref 12.0–15.0)
LYMPHS PCT: 7 %
Lymphs Abs: 0.4 10*3/uL — ABNORMAL LOW (ref 0.7–4.0)
MCH: 31.3 pg (ref 26.0–34.0)
MCHC: 35.9 g/dL (ref 30.0–36.0)
MCV: 87.1 fL (ref 78.0–100.0)
Monocytes Absolute: 0.7 10*3/uL (ref 0.1–1.0)
Monocytes Relative: 11 %
NEUTROS ABS: 4.8 10*3/uL (ref 1.7–7.7)
Neutrophils Relative %: 79 %
PLATELETS: 297 10*3/uL (ref 150–400)
RBC: 3.96 MIL/uL (ref 3.87–5.11)
RDW: 12.7 % (ref 11.5–15.5)
WBC: 6 10*3/uL (ref 4.0–10.5)

## 2017-04-05 LAB — URINALYSIS, ROUTINE W REFLEX MICROSCOPIC
Bilirubin Urine: NEGATIVE
Glucose, UA: NEGATIVE mg/dL
HGB URINE DIPSTICK: NEGATIVE
Ketones, ur: NEGATIVE mg/dL
LEUKOCYTES UA: NEGATIVE
NITRITE: NEGATIVE
PROTEIN: NEGATIVE mg/dL
Specific Gravity, Urine: 1.016 (ref 1.005–1.030)
pH: 6 (ref 5.0–8.0)

## 2017-04-05 LAB — BASIC METABOLIC PANEL
ANION GAP: 8 (ref 5–15)
BUN: 10 mg/dL (ref 6–20)
CO2: 23 mmol/L (ref 22–32)
Calcium: 8.9 mg/dL (ref 8.9–10.3)
Chloride: 103 mmol/L (ref 101–111)
Creatinine, Ser: 0.6 mg/dL (ref 0.44–1.00)
GFR calc Af Amer: >60 mL/min (ref >60)
GLUCOSE: 88 mg/dL (ref 65–99)
POTASSIUM: 3.3 mmol/L — AB (ref 3.5–5.1)
Sodium: 134 mmol/L — ABNORMAL LOW (ref 135–145)

## 2017-04-05 LAB — RAPID STREP SCREEN (MED CTR MEBANE ONLY): STREPTOCOCCUS, GROUP A SCREEN (DIRECT): NEGATIVE

## 2017-04-05 MED ORDER — ACETAMINOPHEN 325 MG PO TABS
650.0000 mg | ORAL_TABLET | Freq: Once | ORAL | Status: AC
  Administered 2017-04-05: 650 mg via ORAL
  Filled 2017-04-05: qty 2

## 2017-04-05 NOTE — ED Provider Notes (Signed)
[facility]-EMERGENCY DEPT Provider Note   CSN: [id number] Arrival date & time: 04/05/17  1524     History   Chief Complaint Chief Complaint  Patient presents with  . Fever    HPI Donna Reeves is a 21 y.o. female.  HPI Patient presents to the emergency room with complaints of fever.  Patient recently delivered a baby on March 29. She had a normal spontaneous vaginal delivery without any complications. She states for the last couple of days she started developing a fever. She has an earache in the left ear and a sore throat. She denies significant coughing. She does have some vaginal burning with urination at her episiotomy site and back pain. She continues to have some spotting after her delivery but that is decreasing. She denies any abdominal pain. She denies any rashes.  She does not have a doctor in this area. She delivered her baby in [place] so she has not called them with her complaints. Past Medical History:  Diagnosis Date  . Degenerative disc disease   . Herniated disc     Patient Active Problem List   Diagnosis Date Noted  . Gestational hypertension 03/26/2017  . Normal labor 03/25/2017  . Low back pain 03/28/2013    History reviewed. No pertinent surgical history.  OB History    Gravida Para Term Preterm AB Living   0 0 1   SAB TAB Ectopic Multiple Live Births   0 0 0 0 1       Home Medications    Prior to Admission medications   Medication Sig Start Date End Date Taking? Authorizing Provider  ondansetron (ZOFRAN ODT) 4 MG disintegrating tablet  ODT q4 hours prn nausea/vomit Patient taking differently: Take 4 mg by mouth every 8 (eight) hours as needed for nausea or vomiting.  08/25/16   Blane Ohara, MD  Prenatal Vit-Fe Fumarate-FA [facility] PRENATAL VITAMINS) 28-0.8 MG TABS Take 1 tablet by mouth daily. Patient taking differently: Take 1 tablet by mouth daily. Gummies 08/03/16   Zadie Rhine, MD    Family History No family history on  file.  Social History Social History  Substance Use Topics  . Smoking status: Never Smoker  . Smokeless tobacco: Never Used  . Alcohol use No     Allergies   Ultram [tramadol] and Augmentin [amoxicillin-pot clavulanate]   Review of Systems Review of Systems  Constitutional: Positive for fever.  HENT: Negative for mouth sores.   Respiratory: Negative for shortness of breath.   Cardiovascular:       No breast pain or redness  Gastrointestinal: Negative for abdominal pain, diarrhea, nausea and vomiting.  Endocrine: Negative for polyuria.  Genitourinary: Positive for dysuria.  Musculoskeletal: Negative for joint swelling.  Neurological: Negative for headaches.  Psychiatric/Behavioral: Negative for confusion.  All other systems reviewed and are negative.    Physical Exam Updated Vital Signs BP 131/77 (BP Location: Left Arm)   Pulse (!) 103   Temp (!) 100.5 F (38.1 C) (Oral)   Resp 18   Ht  (1.6 m)   Wt 48.9 kg   LMP 07/01/2016   SpO2 99%   Breastfeeding? Yes   BMI 19.08 kg/m   Physical Exam  Constitutional: She appears well-developed and well-nourished. No distress.  HENT:  Head: Normocephalic and atraumatic.  Right Ear: Tympanic membrane and external ear normal.  Left Ear: Tympanic membrane and external ear normal.  Mouth/Throat: No oropharyngeal exudate.  Eyes: Conjunctivae are normal. Right eye exhibits no  discharge. Left eye exhibits no discharge. No scleral icterus.  Neck: Neck supple. No tracheal deviation present.  Cardiovascular: Normal rate, regular rhythm and intact distal pulses.   Pulmonary/Chest: Effort normal and breath sounds normal. No stridor. No respiratory distress. She has no wheezes. She has no rales.  Abdominal: Soft. Bowel sounds are normal. She exhibits no distension. There is no tenderness. There is no rebound and no guarding.  Musculoskeletal: She exhibits no edema or tenderness.  Lymphadenopathy:    She has cervical adenopathy.   Neurological: She is alert. She has normal strength. No cranial nerve deficit (no facial droop, extraocular movements intact, no slurred speech) or sensory deficit. She exhibits normal muscle tone. She displays no seizure activity. Coordination normal.  Skin: Skin is warm and dry. No rash noted.  Psychiatric: She has a normal mood and affect.  Nursing note and vitals reviewed.    ED Treatments / Results  Labs (all labs ordered are listed, but only abnormal results are displayed) Labs Reviewed  CBC WITH DIFFERENTIAL/PLATELET - Abnormal; Notable for the following:       Result Value   HCT 34.5 (*)    Lymphs Abs 0.4 (*)    All other components within normal limits  BASIC METABOLIC PANEL - Abnormal; Notable for the following:    Sodium 134 (*)    Potassium 3.3 (*)    All other components within normal limits  RAPID STREP SCREEN (NOT AT [facility])  CULTURE, GROUP A STREP ([facility])  URINALYSIS, ROUTINE W REFLEX MICROSCOPIC     Radiology Dg Chest 2 View  Result Date: 04/05/2017 CLINICAL DATA:  Left-sided chest pain. EXAM: CHEST  2 VIEW COMPARISON:  08/25/2016 FINDINGS: The heart size and mediastinal contours are within normal limits. Both lungs are clear. The visualized skeletal structures are unremarkable. IMPRESSION: No active cardiopulmonary disease. Electronically Signed   By: Kennith Center M.D.   On: 04/05/2017 17:57    Procedures Procedures (including critical care time)  Medications Ordered in ED Medications  acetaminophen (TYLENOL) tablet 650 mg (650 mg Oral Given 04/05/17 1643)     Initial Impression / Assessment and Plan / ED Course  I have reviewed the triage vital signs and the nursing notes.  Pertinent labs & imaging results that were available during my care of the patient were reviewed by me and considered in my medical decision making (see chart for details).   patient's laboratory tests are reassuring. No evidence of UTI. Chest x-ray does not show pneumonia. Patient  denies any symptoms to suggest mastitis. She denies any lower abdominal tenderness to suggest endometritis. Strep test is negative. I suspect she does have a viral pharyngitis. Patient has cervical lymphadenopathy and complaints of for sore throat.  We'll discharge home with supportive care. Follow-up with her primary doctor if not improving.  Final Clinical Impressions(s) / ED Diagnoses   Final diagnoses:  Pharyngitis, unspecified etiology    New Prescriptions New Prescriptions   No medications on file     Linwood Dibbles, MD 04/05/17 1921

## 2017-04-05 NOTE — ED Triage Notes (Addendum)
Pt c/o fever of 101.3, sore throat, left ear pain that started last night. Pt is 1 week postpartum with vaginal delivery. Pt also c/o burning with urination.

## 2017-04-05 NOTE — Discharge Instructions (Signed)
Continue to take Tylenol or Advil as needed for fever and pain, follow-up with your primary care or OB doctor if your symptoms persist, we sent off a strep throat culture today and we'll call you if it comes back positive, the rapid tests was negative today

## 2017-04-05 NOTE — ED Notes (Signed)
takekn to [employer]

## 2017-04-07 LAB — CULTURE, GROUP A STREP (THRC)

## 2017-08-27 ENCOUNTER — Emergency Department (HOSPITAL_COMMUNITY): Payer: Medicaid Other

## 2017-08-27 ENCOUNTER — Encounter (HOSPITAL_COMMUNITY): Payer: Self-pay | Admitting: Emergency Medicine

## 2017-08-27 ENCOUNTER — Emergency Department (HOSPITAL_COMMUNITY)
Admission: EM | Admit: 2017-08-27 | Discharge: 2017-08-27 | Disposition: A | Discharge status: Home / Self Care | Payer: Medicaid Other | Attending: Emergency Medicine | Admitting: Emergency Medicine

## 2017-08-27 DIAGNOSIS — Y92410 Unspecified street and highway as the place of occurrence of the external cause: Secondary | ICD-10-CM | POA: Insufficient documentation

## 2017-08-27 DIAGNOSIS — Y939 Activity, unspecified: Secondary | ICD-10-CM | POA: Insufficient documentation

## 2017-08-27 DIAGNOSIS — S301XXA Contusion of abdominal wall, initial encounter: Secondary | ICD-10-CM | POA: Diagnosis not present

## 2017-08-27 DIAGNOSIS — S9032XA Contusion of left foot, initial encounter: Secondary | ICD-10-CM

## 2017-08-27 DIAGNOSIS — Y999 Unspecified external cause status: Secondary | ICD-10-CM | POA: Insufficient documentation

## 2017-08-27 DIAGNOSIS — S99922A Unspecified injury of left foot, initial encounter: Secondary | ICD-10-CM | POA: Diagnosis present

## 2017-08-27 LAB — POC URINE PREG, ED: Preg Test, Ur: NEGATIVE

## 2017-08-27 MED ORDER — METHOCARBAMOL 500 MG PO TABS
500.0000 mg | ORAL_TABLET | Freq: Once | ORAL | Status: AC
  Administered 2017-08-27: 500 mg via ORAL
  Filled 2017-08-27: qty 1

## 2017-08-27 MED ORDER — IOPAMIDOL (ISOVUE-300) INJECTION 61%
100.0000 mL | Freq: Once | INTRAVENOUS | Status: AC | PRN
  Administered 2017-08-27: 100 mL via INTRAVENOUS

## 2017-08-27 MED ORDER — IBUPROFEN 400 MG PO TABS: 400.0000 mg | ORAL_TABLET | Freq: Four times a day (QID) | ORAL | 0 refills | Status: DC | PRN

## 2017-08-27 MED ORDER — METHOCARBAMOL 500 MG PO TABS: 500.0000 mg | ORAL_TABLET | Freq: Three times a day (TID) | ORAL | 0 refills | Status: DC

## 2017-08-27 MED ORDER — IOPAMIDOL (ISOVUE-300) INJECTION 61%
INTRAVENOUS | Status: AC
  Filled 2017-08-27: qty 30

## 2017-08-27 MED ORDER — IBUPROFEN 400 MG PO TABS
400.0000 mg | ORAL_TABLET | Freq: Once | ORAL | Status: AC
  Administered 2017-08-27: 400 mg via ORAL
  Filled 2017-08-27: qty 1
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
## 2018-05-22 ENCOUNTER — Other Ambulatory Visit: Payer: Self-pay

## 2018-05-22 ENCOUNTER — Encounter (HOSPITAL_COMMUNITY): Payer: Self-pay | Admitting: Emergency Medicine

## 2018-05-22 ENCOUNTER — Emergency Department (HOSPITAL_COMMUNITY)
Admission: EM | Admit: 2018-05-22 | Discharge: 2018-05-22 | Disposition: A | Discharge status: Home / Self Care | Payer: Medicaid Other | Attending: Emergency Medicine | Admitting: Emergency Medicine

## 2018-05-22 DIAGNOSIS — N898 Other specified noninflammatory disorders of vagina: Secondary | ICD-10-CM | POA: Diagnosis not present

## 2018-05-22 DIAGNOSIS — Z202 Contact with and (suspected) exposure to infections with a predominantly sexual mode of transmission: Secondary | ICD-10-CM | POA: Diagnosis present

## 2018-05-22 LAB — WET PREP, GENITAL
Sperm: NONE SEEN
Trich, Wet Prep: NONE SEEN
YEAST WET PREP: NONE SEEN

## 2018-05-22 LAB — URINALYSIS, ROUTINE W REFLEX MICROSCOPIC
Bilirubin Urine: NEGATIVE
Glucose, UA: NEGATIVE mg/dL
Hgb urine dipstick: NEGATIVE
KETONES UR: NEGATIVE mg/dL
LEUKOCYTES UA: NEGATIVE
Nitrite: NEGATIVE
PROTEIN: NEGATIVE mg/dL
Specific Gravity, Urine: 1.03 (ref 1.005–1.030)
pH: 5 (ref 5.0–8.0)

## 2018-05-22 LAB — POC URINE PREG, ED: Preg Test, Ur: NEGATIVE

## 2018-05-22 MED ORDER — AZITHROMYCIN 250 MG PO TABS
1000.0000 mg | ORAL_TABLET | Freq: Once | ORAL | Status: AC
  Administered 2018-05-22: 1000 mg via ORAL
  Filled 2018-05-22: qty 4

## 2018-05-22 MED ORDER — LIDOCAINE HCL (PF) 1 % IJ SOLN
INTRAMUSCULAR | Status: AC
  Filled 2018-05-22: qty 2

## 2018-05-22 MED ORDER — CEFTRIAXONE SODIUM 250 MG IJ SOLR
250.0000 mg | Freq: Once | INTRAMUSCULAR | Status: AC
  Administered 2018-05-22: 250 mg via INTRAMUSCULAR
  Filled 2018-05-22: qty 250

## 2018-05-22 NOTE — ED Provider Notes (Signed)
[facility] EMERGENCY DEPARTMENT Provider Note   CSN: [id number] Arrival date & time: 05/22/18  1625     History   Chief Complaint Chief Complaint  Patient presents with  . Exposure to STD    HPI Donna Reeves is a 22 y.o. female.  HPI   Donna Reeves is a 22 y.o. female who presents to the Emergency Department requesting evaluation for STD.  She complains of vaginal itching and irritation for several days.  Symptoms began after having unprotected intercourse with a recurrent partner.  She states that she feels that her partner is sleeping with other women as well.  She states her symptoms are similar to a previous episode of chlamydia.  She denies abdominal pain, vaginal pain, abnormal vaginal bleeding, and dysuria.  She is not using any form of birth control at present.   Past Medical History:  Diagnosis Date  . Degenerative disc disease   . Herniated disc     Patient Active Problem List   Diagnosis Date Noted  . Gestational hypertension 03/26/2017  . Normal labor 03/25/2017  . Low back pain 03/28/2013    No past surgical history on file.   OB History    Gravida  1   Para  1   Term  1   Preterm  0   AB  0   Living  1     SAB  0   TAB  0   Ectopic  0   Multiple  0   Live Births  1            Home Medications    Prior to Admission medications   Medication Sig Start Date End Date Taking? Authorizing Provider  ibuprofen (ADVIL,MOTRIN) 400 MG tablet Take 1 tablet (400 mg total) by mouth every 6 (six) hours as needed. 08/27/17   Ivery Quale, PA-C  methocarbamol (ROBAXIN) 500 MG tablet Take 1 tablet (500 mg total) by mouth 3 (three) times daily. 08/27/17   Ivery Quale, PA-C  ondansetron (ZOFRAN ODT) 4 MG disintegrating tablet  ODT q4 hours prn nausea/vomit Patient taking differently: Take 4 mg by mouth every 8 (eight) hours as needed for nausea or vomiting.  08/25/16   Blane Ohara, MD  Prenatal Vit-Fe Fumarate-FA [facility] PRENATAL  VITAMINS) 28-0.8 MG TABS Take 1 tablet by mouth daily. Patient taking differently: Take 1 tablet by mouth daily. Gummies 08/03/16   Zadie Rhine, MD    Family History No family history on file.  Social History Social History   Tobacco Use  . Smoking status: Never Smoker  . Smokeless tobacco: Never Used  Substance Use Topics  . Alcohol use: No  . Drug use: Never     Allergies   Ultram [tramadol] and Augmentin [amoxicillin-pot clavulanate]   Review of Systems Review of Systems  Constitutional: Negative for activity change, appetite change, chills and fever.  Respiratory: Negative for chest tightness and shortness of breath.   Gastrointestinal: Negative for abdominal pain, nausea and vomiting.  Genitourinary: Negative for decreased urine volume, difficulty urinating, dysuria, flank pain, frequency, genital sores, hematuria, urgency, vaginal bleeding, vaginal discharge and vaginal pain.       Vaginal itching and irritation  Musculoskeletal: Negative for back pain.  Skin: Negative for rash.  Neurological: Negative for dizziness, weakness and numbness.  Hematological: Negative for adenopathy.  Psychiatric/Behavioral: Negative for confusion.  All other systems reviewed and are negative.    Physical Exam Updated Vital Signs BP 110/77 (BP Location: Right Arm)  Pulse 80   Temp 99.8 F (37.7 C) (Oral)   Resp 14   Ht  (1.6 m)   Wt 59 kg (130 lb)   LMP 05/15/2018   SpO2 97%   BMI 23.03 kg/m   Physical Exam  Constitutional: She is oriented to person, place, and time. She appears well-developed and well-nourished. No distress.  HENT:  Mouth/Throat: Oropharynx is clear and moist.  Cardiovascular: Normal rate and regular rhythm.  No murmur heard. Pulmonary/Chest: Effort normal and breath sounds normal. No respiratory distress.  Abdominal: Soft. She exhibits no distension. There is no tenderness. There is no guarding.  Genitourinary: Uterus normal. Cervix exhibits  no motion tenderness and no discharge. Right adnexum displays no mass and no tenderness. Left adnexum displays no mass and no tenderness. No bleeding in the vagina. No foreign body in the vagina. No vaginal discharge found.  Genitourinary Comments: Exam assisted by nursing staff.  No tenderness or adnexal masses on exam.  No significant vaginal discharge, no bleeding, no cervical motion tenderness.  Normal-appearing external genitalia.  No rash or lesions  Musculoskeletal: Normal range of motion.  Neurological: She is alert and oriented to person, place, and time.  Skin: Skin is warm and dry.  Psychiatric: She has a normal mood and affect.  Nursing note and vitals reviewed.    ED Treatments / Results  Labs (all labs ordered are listed, but only abnormal results are displayed) Labs Reviewed  WET PREP, GENITAL - Abnormal; Notable for the following components:      Result Value   Clue Cells Wet Prep HPF POC PRESENT (*)    WBC, Wet Prep HPF POC FEW (*)    All other components within normal limits  URINALYSIS, ROUTINE W REFLEX MICROSCOPIC  RPR  HIV ANTIBODY (ROUTINE TESTING)  POC URINE PREG, ED  GC/CHLAMYDIA PROBE AMP ([place]) NOT AT [facility]    EKG None  Radiology No results found.  Procedures Procedures (including critical care time)  Medications Ordered in ED Medications  cefTRIAXone (ROCEPHIN) injection 250 mg (has no administration in time range)  azithromycin (ZITHROMAX) tablet 1,000 mg (has no administration in time range)  lidocaine (PF) (XYLOCAINE) 1 % injection (has no administration in time range)     Initial Impression / Assessment and Plan / ED Course  I have reviewed the triage vital signs and the nursing notes.  Pertinent labs & imaging results that were available during my care of the patient were reviewed by me and considered in my medical decision making (see chart for details).     Vitals reviewed, patient well-appearing.  Abdomen soft and nontender.   Agrees to treatment with IM Rocephin and p.o. Zithromax.  Remaining cultures are pending.  Patient counseled on safe sex practices and further STD screening through the health department if needed.  Final Clinical Impressions(s) / ED Diagnoses   Final diagnoses:  Possible exposure to STD    ED Discharge Orders    None       Rosey Bath 05/22/18 2025    Bethann Berkshire, MD 05/22/18 [phone]

## 2018-05-22 NOTE — Discharge Instructions (Addendum)
You have been given medication to treat for gonorrhea and for chlamydia, your cultures are still pending you will be notified in 3 to 5 days for any positive results.  You may follow-up with the health department for future STD testing if needed

## 2018-05-22 NOTE — ED Triage Notes (Signed)
Patient requesting STD check. Per patient had intercourse and started having itching. Denies any discharge or other symptoms.

## 2018-05-22 NOTE — ED Notes (Signed)
Pt asks to give phone to friend with whom she traveled to ED to be assessed with

## 2018-05-24 LAB — RPR: RPR: NONREACTIVE

## 2018-05-24 LAB — GC/CHLAMYDIA PROBE AMP (~~LOC~~) NOT AT ARMC
Chlamydia: NEGATIVE
Neisseria Gonorrhea: NEGATIVE

## 2018-05-24 LAB — HIV ANTIBODY (ROUTINE TESTING W REFLEX): HIV Screen 4th Generation wRfx: NONREACTIVE

## 2018-10-05 ENCOUNTER — Encounter (HOSPITAL_COMMUNITY): Payer: Self-pay | Admitting: Emergency Medicine

## 2018-10-05 ENCOUNTER — Other Ambulatory Visit: Payer: Self-pay

## 2018-10-05 ENCOUNTER — Emergency Department (HOSPITAL_COMMUNITY)
Admission: EM | Admit: 2018-10-05 | Discharge: 2018-10-05 | Disposition: A | Discharge status: Home / Self Care | Payer: Medicaid Other | Attending: Emergency Medicine | Admitting: Emergency Medicine

## 2018-10-05 DIAGNOSIS — N390 Urinary tract infection, site not specified: Secondary | ICD-10-CM | POA: Diagnosis not present

## 2018-10-05 DIAGNOSIS — R1013 Epigastric pain: Secondary | ICD-10-CM | POA: Diagnosis not present

## 2018-10-05 DIAGNOSIS — Z79899 Other long term (current) drug therapy: Secondary | ICD-10-CM | POA: Insufficient documentation

## 2018-10-05 DIAGNOSIS — E860 Dehydration: Secondary | ICD-10-CM | POA: Insufficient documentation

## 2018-10-05 DIAGNOSIS — R197 Diarrhea, unspecified: Secondary | ICD-10-CM | POA: Insufficient documentation

## 2018-10-05 DIAGNOSIS — R102 Pelvic and perineal pain: Secondary | ICD-10-CM | POA: Insufficient documentation

## 2018-10-05 DIAGNOSIS — R112 Nausea with vomiting, unspecified: Secondary | ICD-10-CM | POA: Diagnosis present

## 2018-10-05 DIAGNOSIS — R079 Chest pain, unspecified: Secondary | ICD-10-CM | POA: Diagnosis not present

## 2018-10-05 LAB — COMPREHENSIVE METABOLIC PANEL
ALT: 14 U/L (ref 0–44)
ANION GAP: 10 (ref 5–15)
AST: 18 U/L (ref 15–41)
Albumin: 4.9 g/dL (ref 3.5–5.0)
Alkaline Phosphatase: 105 U/L (ref 38–126)
BUN: 16 mg/dL (ref 6–20)
CALCIUM: 9.7 mg/dL (ref 8.9–10.3)
CO2: 23 mmol/L (ref 22–32)
Chloride: 105 mmol/L (ref 98–111)
Creatinine, Ser: 0.64 mg/dL (ref 0.44–1.00)
GFR calc Af Amer: >60 mL/min (ref >60)
GFR calc non Af Amer: >60 mL/min (ref >60)
GLUCOSE: 119 mg/dL — AB (ref 70–99)
Potassium: 3.8 mmol/L (ref 3.5–5.1)
SODIUM: 138 mmol/L (ref 135–145)
Total Bilirubin: 1.5 mg/dL — ABNORMAL HIGH (ref 0.3–1.2)
Total Protein: 8.4 g/dL — ABNORMAL HIGH (ref 6.5–8.1)

## 2018-10-05 LAB — CBC
HEMATOCRIT: 44.1 % (ref 36.0–46.0)
Hemoglobin: 15.2 g/dL — ABNORMAL HIGH (ref 12.0–15.0)
MCH: 29.3 pg (ref 26.0–34.0)
MCHC: 34.5 g/dL (ref 30.0–36.0)
MCV: 85.1 fL (ref 80.0–100.0)
PLATELETS: 280 10*3/uL (ref 150–400)
RBC: 5.18 MIL/uL — AB (ref 3.87–5.11)
RDW: 12.6 % (ref 11.5–15.5)
WBC: 11 10*3/uL — ABNORMAL HIGH (ref 4.0–10.5)
nRBC: 0 % (ref 0.0–0.2)

## 2018-10-05 LAB — URINALYSIS, ROUTINE W REFLEX MICROSCOPIC
GLUCOSE, UA: 100 mg/dL — AB
HGB URINE DIPSTICK: NEGATIVE
Ketones, ur: >80 mg/dL — AB
Leukocytes, UA: NEGATIVE
Nitrite: POSITIVE — AB
PH: 5.5 (ref 5.0–8.0)
PROTEIN: 100 mg/dL — AB

## 2018-10-05 LAB — URINALYSIS, MICROSCOPIC (REFLEX)
RBC / HPF: NONE SEEN RBC/hpf (ref 0–5)
Squamous Epithelial / LPF: NONE SEEN (ref 0–5)

## 2018-10-05 LAB — I-STAT BETA HCG BLOOD, ED (MC, WL, AP ONLY): I-stat hCG, quantitative: <5 m[IU]/mL (ref <5)

## 2018-10-05 LAB — LIPASE, BLOOD: LIPASE: 23 U/L (ref 11–51)

## 2018-10-05 MED ORDER — LOPERAMIDE HCL 2 MG PO CAPS
4.0000 mg | ORAL_CAPSULE | Freq: Once | ORAL | Status: AC
Start: 2018-10-05 — End: 2018-10-05
  Administered 2018-10-05: 4 mg via ORAL
  Filled 2018-10-05: qty 2

## 2018-10-05 MED ORDER — SODIUM CHLORIDE 0.9 % IV BOLUS
1000.0000 mL | Freq: Once | INTRAVENOUS | Status: AC
  Administered 2018-10-05 (×2): 1000 mL via INTRAVENOUS

## 2018-10-05 MED ORDER — SODIUM CHLORIDE 0.9 % IV BOLUS
1000.0000 mL | Freq: Once | INTRAVENOUS | Status: AC
  Administered 2018-10-05: 1000 mL via INTRAVENOUS

## 2018-10-05 MED ORDER — METOCLOPRAMIDE HCL 5 MG/ML IJ SOLN
10.0000 mg | Freq: Once | INTRAMUSCULAR | Status: AC
  Administered 2018-10-05: 10 mg via INTRAVENOUS
  Filled 2018-10-05: qty 2

## 2018-10-05 MED ORDER — CIPROFLOXACIN IN D5W 400 MG/200ML IV SOLN
400.0000 mg | Freq: Once | INTRAVENOUS | Status: AC
  Administered 2018-10-05: 400 mg via INTRAVENOUS
  Filled 2018-10-05: qty 200

## 2018-10-05 MED ORDER — ONDANSETRON HCL 4 MG/2ML IJ SOLN
4.0000 mg | Freq: Once | INTRAMUSCULAR | Status: AC
  Administered 2018-10-05: 4 mg via INTRAVENOUS
  Filled 2018-10-05: qty 2

## 2018-10-05 MED ORDER — ONDANSETRON HCL 4 MG PO TABS: 4.0000 mg | ORAL_TABLET | Freq: Three times a day (TID) | ORAL | 0 refills | Status: DC | PRN

## 2018-10-05 MED ORDER — CIPROFLOXACIN HCL 500 MG PO TABS: 500.0000 mg | ORAL_TABLET | Freq: Two times a day (BID) | ORAL | 0 refills | Status: DC

## 2018-10-05 NOTE — ED Notes (Signed)
Pt c/o nausea at this time.

## 2018-10-05 NOTE — ED Provider Notes (Signed)
[facility] EMERGENCY DEPARTMENT Provider Note   CSN: [id number] Arrival date & time: 10/05/18  0343  Time seen 4:10 AM  History   Chief Complaint Chief Complaint  Patient presents with  . Abdominal Pain    HPI Donna Reeves is a 22 y.o. female.  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  She states it would happen daily at work but she also had it at home and in a controlled environment with air conditioning.  She states she would get hot and then get flushed and then she would throw up.  She states it was happening 4-5 times a day.  Tonight she states that her symptoms have gotten worse.  She has had 30 episodes of vomiting since this morning.  She started having diarrhea today about 11 PM and had 5 watery episodes.  She is been having abdominal pain a little bit every day and indicates the epigastric and the lower abdomen that would come and go and last about 20 to 30 minutes.  She was unsure if fever.  She states she had urinary output yesterday about 30 times but today she is only urinated at 6 PM and then when she got to the ED.  She denies being around anybody else who is sick.  She denies eating anything different.  She states about a week ago she took 2 antibiotic pills  for possible exposure to chlamydia.  She was treated at [facility] in [place].  She also complains of left lower chest pain.  PCP Patient, No Pcp Per   Past Medical History:  Diagnosis Date  . Degenerative disc disease   . Herniated disc     Patient Active Problem List   Diagnosis Date Noted  . Gestational hypertension 03/26/2017  . Normal labor 03/25/2017  . Low back pain 03/28/2013    History reviewed. No pertinent surgical history.   OB History    Gravida  1   Para  1   Term  1   Preterm  0   AB  0   Living  1     SAB  0   TAB  0   Ectopic  0   Multiple  0   Live Births  1            Home Medications    Prior to Admission medications   Medication Sig Start Date End Date Taking? Authorizing Provider  ciprofloxacin (CIPRO) 500 MG tablet Take 1 tablet (500 mg total) by mouth 2 (two) times daily. 10/05/18   Devoria Albe, MD  ibuprofen (ADVIL,MOTRIN) 400 MG tablet Take 1 tablet (400 mg total) by mouth every 6 (six) hours as needed. 08/27/17   Ivery Quale, PA-C  methocarbamol (ROBAXIN) 500 MG tablet Take 1 tablet (500 mg total) by mouth 3 (three) times daily. 08/27/17   Ivery Quale, PA-C  ondansetron (ZOFRAN ODT) 4 MG disintegrating tablet 4mg  ODT q4 hours prn nausea/vomit Patient taking differently: Take 4 mg by mouth every 8 (eight) hours as needed for nausea or vomiting.  08/25/16   Blane Ohara, MD  ondansetron (ZOFRAN) 4 MG tablet Take 1 tablet (4 mg total) by mouth every 8 (eight) hours as needed for nausea or vomiting. 10/05/18   Devoria Albe, MD  Prenatal Vit-Fe Fumarate-FA [facility] PRENATAL  VITAMINS) 28-0.8 MG TABS Take 1 tablet by mouth daily. Patient taking differently: Take 1 tablet by mouth daily. Gummies 08/03/16   Zadie Rhine, MD    Family History History reviewed. No pertinent family history.  Social History Social History   Tobacco Use  . Smoking status: Never Smoker  . Smokeless tobacco: Never Used  Substance Use Topics  . Alcohol use: No  . Drug use: Never  employed   Allergies   Ultram [tramadol] and Augmentin [amoxicillin-pot clavulanate]   Review of Systems Review of Systems  All other systems reviewed and are negative.    Physical Exam Updated Vital Signs BP 113/73   Pulse 82   Temp 97.9 F (36.6 C) (Oral)   Resp (!) 22   Ht 5\' 3"  (1.6 m)   Wt 59 kg   LMP 10/05/2018   SpO2 100%   BMI 23.03 kg/m   Physical Exam  Constitutional: She is oriented to person, place, and time. She appears well-developed and well-nourished.  Non-toxic appearance. She  does not appear ill. No distress.  HENT:  Head: Normocephalic and atraumatic.  Right Ear: External ear normal.  Left Ear: External ear normal.  Nose: Nose normal. No mucosal edema or rhinorrhea.  Mouth/Throat: Oropharynx is clear and moist and mucous membranes are normal. No dental abscesses or uvula swelling.  Eyes: Pupils are equal, round, and reactive to light. Conjunctivae and EOM are normal.  Neck: Normal range of motion and full passive range of motion without pain. Neck supple.  Cardiovascular: Normal rate, regular rhythm and normal heart sounds. Exam reveals no gallop and no friction rub.  No murmur heard. Pulmonary/Chest: Effort normal and breath sounds normal. No respiratory distress. She has no wheezes. She has no rhonchi. She has no rales. She exhibits no tenderness and no crepitus.  Abdominal: Soft. Normal appearance and bowel sounds are normal. She exhibits no distension. There is tenderness in the epigastric area and suprapubic area. There is no rebound and no guarding.  Musculoskeletal: Normal range of motion. She exhibits no edema or tenderness.  Moves all extremities well.   Neurological: She is alert and oriented to person, place, and time. She has normal strength. No cranial nerve deficit.  Skin: Skin is warm, dry and intact. No rash noted. No erythema. No pallor.  Psychiatric: She has a normal mood and affect. Her speech is normal and behavior is normal. Her mood appears not anxious.  Nursing note and vitals reviewed.    ED Treatments / Results  Labs (all labs ordered are listed, but only abnormal results are displayed) Results for orders placed or performed during the hospital encounter of 10/05/18  Lipase, blood  Result Value Ref Range   Lipase 23 11 - 51 U/L  Comprehensive metabolic panel  Result Value Ref Range   Sodium 138 135 - 145 mmol/L   Potassium 3.8 3.5 - 5.1 mmol/L   Chloride 105 98 - 111 mmol/L   CO2 23 22 - 32 mmol/L   Glucose, Bld 119 (H) 70 - 99  mg/dL   BUN 16 6 - 20 mg/dL   Creatinine, Ser 0.98 0.44 - 1.00 mg/dL   Calcium 9.7 8.9 - 11.9 mg/dL   Total Protein 8.4 (H) 6.5 - 8.1 g/dL   Albumin 4.9 3.5 - 5.0 g/dL   AST 18 15 - 41 U/L   ALT 14 0 - 44 U/L   Alkaline Phosphatase 105 38 - 126 U/L   Total Bilirubin 1.5 (H) 0.3 - 1.2  mg/dL   GFR calc non Af Amer >60 >60 mL/min   GFR calc Af Amer >60 >60 mL/min   Anion gap 10 5 - 15  CBC  Result Value Ref Range   WBC 11.0 (H) 4.0 - 10.5 K/uL   RBC 5.18 (H) 3.87 - 5.11 MIL/uL   Hemoglobin 15.2 (H) 12.0 - 15.0 g/dL   HCT 40.9 81.1 - 91.4 %   MCV 85.1 80.0 - 100.0 fL   MCH 29.3 26.0 - 34.0 pg   MCHC 34.5 30.0 - 36.0 g/dL   RDW 78.2 95.6 - 21.3 %   Platelets 280 150 - 400 K/uL   nRBC 0.0 0.0 - 0.2 %  Urinalysis, Routine w reflex microscopic  Result Value Ref Range   Color, Urine YELLOW YELLOW   APPearance CLOUDY (A) CLEAR   Specific Gravity, Urine >1.030 (H) 1.005 - 1.030   pH 5.5 5.0 - 8.0   Glucose, UA 100 (A) NEGATIVE mg/dL   Hgb urine dipstick NEGATIVE NEGATIVE   Bilirubin Urine MODERATE (A) NEGATIVE   Ketones, ur >80 (A) NEGATIVE mg/dL   Protein, ur 086 (A) NEGATIVE mg/dL   Nitrite POSITIVE (A) NEGATIVE   Leukocytes, UA NEGATIVE NEGATIVE  Urinalysis, Microscopic (reflex)  Result Value Ref Range   RBC / HPF NONE SEEN 0 - 5 RBC/hpf   WBC, UA 0-5 0 - 5 WBC/hpf   Bacteria, UA MANY (A) NONE SEEN   Squamous Epithelial / LPF NONE SEEN 0 - 5  I-Stat beta hCG blood, ED  Result Value Ref Range   I-stat hCG, quantitative <5.0 <5 mIU/mL   Comment 3           Laboratory interpretation all normal except concentrated urine with questionable UTI, mild concentration of the hemoglobin consistent with dehydration    EKG None  Radiology No results found.  Procedures Procedures (including critical care time)  Medications Ordered in ED Medications  sodium chloride 0.9 % bolus 1,000 mL (1,000 mLs Intravenous New Bag/Given 10/05/18 0604)  ciprofloxacin (CIPRO) IVPB 400 mg (has  no administration in time range)  ondansetron (ZOFRAN) injection 4 mg (has no administration in time range)  sodium chloride 0.9 % bolus 1,000 mL (0 mLs Intravenous Stopped 10/05/18 0602)  sodium chloride 0.9 % bolus 1,000 mL (0 mLs Intravenous Stopped 10/05/18 0602)  ondansetron (ZOFRAN) injection 4 mg (4 mg Intravenous Given 10/05/18 0445)  loperamide (IMODIUM) capsule 4 mg (4 mg Oral Given 10/05/18 0443)     Initial Impression / Assessment and Plan / ED Course  I have reviewed the triage vital signs and the nursing notes.  Pertinent labs & imaging results that were available during my care of the patient were reviewed by me and considered in my medical decision making (see chart for details).     Patient was given IV fluids, IV nausea and oral diarrhea medications.  She was advised if she had more diarrhea to collect a sample to test for questionable C. difficile or other enteric pathogen.  During the course of my exam patient all of a sudden set up said she felt hot and she was turning red.  However the nausea faded.  I tried to explain to the patient several times that the feeling hot and getting flushed and sweaty is part of the vomiting process.  She seems resistant to this idea.  Recheck at 6:20 AM patient states she is feeling better.  Her heart rate is now in the 90s.  She states she has  to urinate very badly.  She now states yesterday she was urinating only small amounts.  She denies any dysuria.  We discussed her urine was questionable for a infection.  She was given IV Cipro since she is having some urinary symptoms.  We discussed her lab work which was consistent with dehydration with the concentration of her hemoglobin and the ketones in her urine.  Patient has not had diarrhea since she arrived to the ED.  When she went to the bathroom she states it made her feel little bit nauseated again.  She was given more Zofran.  Final Clinical Impressions(s) / ED Diagnoses   Final diagnoses:    Nausea vomiting and diarrhea  Dehydration  Urinary tract infection without hematuria, site unspecified    ED Discharge Orders         Ordered    ondansetron (ZOFRAN) 4 MG tablet  Every 8 hours PRN     10/05/18 0643    ciprofloxacin (CIPRO) 500 MG tablet  2 times daily     10/05/18 1610          Plan discharge  Devoria Albe, MD, Concha Pyo, MD 10/05/18 [phone]

## 2018-10-05 NOTE — Discharge Instructions (Addendum)
Drink plenty of fluids (clear liquids) then start a bland diet later this morning such as toast, crackers, jello, [employer] chicken noodle soup. Use the zofran for nausea or vomiting. Take imodium OTC for diarrhea. Avoid milk products until the diarrhea is gone.  Take the Cipro antibiotics until gone.

## 2018-10-05 NOTE — ED Triage Notes (Signed)
Over last two weeks, patient states if she gets hot, she starts having nausea and vomiting, starting tonight loose stools.

## 2018-10-06 LAB — URINE CULTURE: SPECIAL REQUESTS: NORMAL

## 2018-11-18 ENCOUNTER — Emergency Department (HOSPITAL_COMMUNITY): Payer: Medicaid Other

## 2018-11-18 ENCOUNTER — Other Ambulatory Visit: Payer: Self-pay

## 2018-11-18 ENCOUNTER — Encounter (HOSPITAL_COMMUNITY): Payer: Self-pay

## 2018-11-18 ENCOUNTER — Emergency Department (HOSPITAL_COMMUNITY)
Admission: EM | Admit: 2018-11-18 | Discharge: 2018-11-18 | Disposition: A | Discharge status: Home / Self Care | Payer: Medicaid Other | Attending: Emergency Medicine | Admitting: Emergency Medicine

## 2018-11-18 DIAGNOSIS — Z79899 Other long term (current) drug therapy: Secondary | ICD-10-CM | POA: Diagnosis not present

## 2018-11-18 DIAGNOSIS — N938 Other specified abnormal uterine and vaginal bleeding: Secondary | ICD-10-CM | POA: Diagnosis not present

## 2018-11-18 DIAGNOSIS — N939 Abnormal uterine and vaginal bleeding, unspecified: Secondary | ICD-10-CM

## 2018-11-18 LAB — URINALYSIS, ROUTINE W REFLEX MICROSCOPIC
Bilirubin Urine: NEGATIVE
GLUCOSE, UA: NEGATIVE mg/dL
KETONES UR: NEGATIVE mg/dL
Leukocytes, UA: NEGATIVE
Nitrite: NEGATIVE
PROTEIN: NEGATIVE mg/dL
Specific Gravity, Urine: 1.027 (ref 1.005–1.030)
pH: 6 (ref 5.0–8.0)

## 2018-11-18 LAB — CBC WITH DIFFERENTIAL/PLATELET
ABS IMMATURE GRANULOCYTES: 0.01 10*3/uL (ref 0.00–0.07)
Basophils Absolute: 0 10*3/uL (ref 0.0–0.1)
Basophils Relative: 1 %
EOS PCT: 3 %
Eosinophils Absolute: 0.1 10*3/uL (ref 0.0–0.5)
HEMATOCRIT: 35.6 % — AB (ref 36.0–46.0)
HEMOGLOBIN: 11.9 g/dL — AB (ref 12.0–15.0)
Immature Granulocytes: 0 %
LYMPHS ABS: 1.6 10*3/uL (ref 0.7–4.0)
LYMPHS PCT: 36 %
MCH: 28.8 pg (ref 26.0–34.0)
MCHC: 33.4 g/dL (ref 30.0–36.0)
MCV: 86.2 fL (ref 80.0–100.0)
Monocytes Absolute: 0.5 10*3/uL (ref 0.1–1.0)
Monocytes Relative: 12 %
NEUTROS ABS: 2.1 10*3/uL (ref 1.7–7.7)
Neutrophils Relative %: 48 %
Platelets: 284 10*3/uL (ref 150–400)
RBC: 4.13 MIL/uL (ref 3.87–5.11)
RDW: 12.8 % (ref 11.5–15.5)
WBC: 4.5 10*3/uL (ref 4.0–10.5)
nRBC: 0 % (ref 0.0–0.2)

## 2018-11-18 LAB — BASIC METABOLIC PANEL
Anion gap: 7 (ref 5–15)
BUN: 7 mg/dL (ref 6–20)
CHLORIDE: 109 mmol/L (ref 98–111)
CO2: 23 mmol/L (ref 22–32)
Calcium: 9 mg/dL (ref 8.9–10.3)
Creatinine, Ser: 0.61 mg/dL (ref 0.44–1.00)
GFR calc Af Amer: >60 mL/min (ref >60)
GFR calc non Af Amer: >60 mL/min (ref >60)
Glucose, Bld: 103 mg/dL — ABNORMAL HIGH (ref 70–99)
POTASSIUM: 3.6 mmol/L (ref 3.5–5.1)
SODIUM: 139 mmol/L (ref 135–145)

## 2018-11-18 LAB — HCG, QUANTITATIVE, PREGNANCY: hCG, Beta Chain, Quant, S: <1 m[IU]/mL (ref <5)

## 2018-11-18 LAB — POC URINE PREG, ED: PREG TEST UR: NEGATIVE

## 2018-11-18 LAB — WET PREP, GENITAL
CLUE CELLS WET PREP: NONE SEEN
Sperm: NONE SEEN
TRICH WET PREP: NONE SEEN
Yeast Wet Prep HPF POC: NONE SEEN

## 2018-11-18 NOTE — ED Notes (Signed)
Back from [place].

## 2018-11-18 NOTE — Discharge Instructions (Signed)
Please take 600mg  of ibuprofen every 6 hours to help with pain and bleeding.  [facility] to your abnormal uterine bleeding.   Return to the emergency department for persistent/continued heavy bleeding, lightheadedness, dizziness, or any new or worsening symptoms.

## 2018-11-18 NOTE — ED Provider Notes (Signed)
[facility] Provider Note   CSN: [id number] Arrival date & time: 11/18/18  1147     History   Chief Complaint Chief Complaint  Patient presents with  . Vaginal Bleeding    HPI Donna Reeves is a 22 y.o. female.  HPI   Patient is a 22 year old female with a history of DDD, who presents the emergency department today for evaluation of vaginal bleeding.  Patient states that for the last several days she has been having heavy bleeding and has been passing clots.  States she has been going through 1-2 pads per hour.  She states that she just had her menstrual cycle 1 week ago.  She normally has very regular menses and her last menstrual cycle was the normal time of the month and the amount of bleeding was within normal limits.  She denies any vaginal discharge.  She has had some urinary frequency, but denies any dysuria or hematuria.  No fevers or chills.  She reports some bilateral lower abdominal cramping, but she denies persistent abdominal pain.  Also reports nausea, but no vomiting.  No diarrhea or constipation.  Fevers or chills.  States that she was sexually active several weeks ago and use protection.  She is not on any form of birth control currently.  Past Medical History:  Diagnosis Date  . Degenerative disc disease   . Herniated disc     Patient Active Problem List   Diagnosis Date Noted  . Gestational hypertension 03/26/2017  . Normal labor 03/25/2017  . Low back pain 03/28/2013    History reviewed. No pertinent surgical history.   OB History    Gravida  1   Para  1   Term  1   Preterm  0   AB  0   Living  1     SAB  0   TAB  0   Ectopic  0   Multiple  0   Live Births  1            Home Medications    Prior to Admission medications   Medication Sig Start Date End Date Taking? Authorizing Provider  ciprofloxacin (CIPRO) 500 MG tablet Take 1 tablet (500 mg total) by mouth 2 (two) times daily.  10/05/18   Devoria AlbeKnapp, Iva, MD  ibuprofen (ADVIL,MOTRIN) 400 MG tablet Take 1 tablet (400 mg total) by mouth every 6 (six) hours as needed. 08/27/17   Ivery QualeBryant, Hobson, PA-C  methocarbamol (ROBAXIN) 500 MG tablet Take 1 tablet (500 mg total) by mouth 3 (three) times daily. 08/27/17   Ivery QualeBryant, Hobson, PA-C  ondansetron (ZOFRAN ODT) 4 MG disintegrating tablet 4mg  ODT q4 hours prn nausea/vomit Patient taking differently: Take 4 mg by mouth every 8 (eight) hours as needed for nausea or vomiting.  08/25/16   Blane OharaZavitz, Joshua, MD  ondansetron (ZOFRAN) 4 MG tablet Take 1 tablet (4 mg total) by mouth every 8 (eight) hours as needed for nausea or vomiting. 10/05/18   Devoria AlbeKnapp, Iva, MD  Prenatal Vit-Fe Fumarate-FA [facility](GNP PRENATAL VITAMINS) 28-0.8 MG TABS Take 1 tablet by mouth daily. Patient taking differently: Take 1 tablet by mouth daily. Gummies 08/03/16   Zadie RhineWickline, Donald, MD    Family History History reviewed. No pertinent family history.  Social History Social History   Tobacco Use  . Smoking status: Never Smoker  . Smokeless tobacco: Never Used  Substance Use Topics  . Alcohol use: No  . Drug use: Never     Allergies  Ultram [tramadol] and Augmentin [amoxicillin-pot clavulanate]   Review of Systems Review of Systems  Constitutional: Negative for fever.  HENT: Negative for ear pain and sore throat.   Eyes: Negative for visual disturbance.  Respiratory: Negative for shortness of breath.   Cardiovascular: Negative for chest pain.  Gastrointestinal: Positive for abdominal pain and nausea. Negative for constipation, diarrhea and vomiting.  Genitourinary: Positive for flank pain, frequency, pelvic pain and vaginal bleeding. Negative for dysuria, hematuria, urgency and vaginal discharge.  Musculoskeletal: Negative for back pain.  Skin: Negative for rash.  Neurological: Negative for headaches.  All other systems reviewed and are negative.    Physical Exam Updated Vital Signs BP 120/80 (BP Location:  Right Arm)   Pulse 75   Temp 98 F (36.7 C) (Oral)   Resp 18   Ht 5\' 3"  (1.6 m)   Wt 56.7 kg   LMP 11/13/2018   SpO2 99%   BMI 22.14 kg/m   Physical Exam  Constitutional: She appears well-developed and well-nourished. No distress.  HENT:  Head: Normocephalic and atraumatic.  Eyes: Conjunctivae are normal.  Neck: Neck supple.  Cardiovascular: Normal rate, regular rhythm and normal heart sounds.  No murmur heard. Pulmonary/Chest: Effort normal and breath sounds normal. No respiratory distress. She has no wheezes.  Abdominal: Soft. Bowel sounds are normal.  Generalized ttp that seems distractable. No rigidity, rebound or guarding  Genitourinary:  Genitourinary Comments: Exam performed by Karrie Meres,  exam chaperoned Date: 11/18/2018 Pelvic exam: normal external genitalia without evidence of trauma. VULVA: normal appearing vulva with no masses, tenderness or lesion. VAGINA: normal appearing vagina with normal color and discharge, no lesions. There is a moderate amount of blood in the vault. CERVIX: normal appearing cervix without lesions, cervical motion tenderness absent, cervical os appears to be possibly open with active bleeding, clots noted, Wet prep and DNA probe for chlamydia and GC obtained.   ADNEXA: normal adnexa in size, left adnexal ttp present UTERUS: uterus is normal size, shape, consistency. There is uterine ttp.   Musculoskeletal: She exhibits no edema.  Neurological: She is alert.  Skin: Skin is warm and dry.  Psychiatric: She has a normal mood and affect.  Nursing note and vitals reviewed.   ED Treatments / Results  Labs (all labs ordered are listed, but only abnormal results are displayed) Labs Reviewed  WET PREP, GENITAL - Abnormal; Notable for the following components:      Result Value   WBC, Wet Prep HPF POC FEW (*)    All other components within normal limits  URINALYSIS, ROUTINE W REFLEX MICROSCOPIC - Abnormal; Notable for the following  components:   Hgb urine dipstick LARGE (*)    Bacteria, UA FEW (*)    All other components within normal limits  CBC WITH DIFFERENTIAL/PLATELET - Abnormal; Notable for the following components:   Hemoglobin 11.9 (*)    HCT 35.6 (*)    All other components within normal limits  BASIC METABOLIC PANEL - Abnormal; Notable for the following components:   Glucose, Bld 103 (*)    All other components within normal limits  HCG, QUANTITATIVE, PREGNANCY  POC URINE PREG, ED  GC/CHLAMYDIA PROBE AMP ([place]) NOT AT [facility]    EKG None  Radiology US Transvaginal Non-ob  Result Date: 11/18/2018 CLINICAL DATA:  Adnexal tenderness to palpation.  Vaginal bleeding. EXAM: TRANSABDOMINAL AND TRANSVAGINAL ULTRASOUND OF PELVIS DOPPLER ULTRASOUND OF OVARIES TECHNIQUE: Both transabdominal and transvaginal ultrasound examinations of the pelvis were performed. Transabdominal technique  was performed for global imaging of the pelvis including uterus, ovaries, adnexal regions, and pelvic cul-de-sac. It was necessary to proceed with endovaginal exam following the transabdominal exam to visualize the uterus and ovaries. Color and duplex Doppler ultrasound was utilized to evaluate blood flow to the ovaries. None. CT 08/27/2017. FINDINGS: Uterus Measurements: 8.9 x 4.1 x 5.4 cm = volume: 104.3 mL. No fibroids or other mass visualized. Endometrium Thickness: 5 mm.  No focal abnormality visualized. Right ovary Measurements: 2.7 x 2.2 x 2.3 cm = volume: 7.3 mL. Normal appearance/no adnexal mass. Small follicles. Left ovary Measurements: 3.0 x 2.5 x 2.4 cm = volume: 9.3 mL. Normal appearance/no adnexal mass. Small follicles. Other findings Small a moderate amount of free pelvic fluid. Pelvic varicosities noted. Doppler ultrasound ovaries reveals normal bilateral arterial and venous flow. IMPRESSION: 1.  Pelvic varicosities noted. 2.  Small to moderate amount free pelvic fluid. 3.  Exam otherwise unremarkable.  No evidence of  ovarian torsion. Electronically Signed   By: Maisie Fus  Register   On: 11/18/2018 15:12   US Pelvis Complete  Result Date: 11/18/2018 CLINICAL DATA:  Adnexal tenderness to palpation.  Vaginal bleeding. EXAM: TRANSABDOMINAL AND TRANSVAGINAL ULTRASOUND OF PELVIS DOPPLER ULTRASOUND OF OVARIES TECHNIQUE: Both transabdominal and transvaginal ultrasound examinations of the pelvis were performed. Transabdominal technique was performed for global imaging of the pelvis including uterus, ovaries, adnexal regions, and pelvic cul-de-sac. It was necessary to proceed with endovaginal exam following the transabdominal exam to visualize the uterus and ovaries. Color and duplex Doppler ultrasound was utilized to evaluate blood flow to the ovaries. None. CT 08/27/2017. FINDINGS: Uterus Measurements: 8.9 x 4.1 x 5.4 cm = volume: 104.3 mL. No fibroids or other mass visualized. Endometrium Thickness: 5 mm.  No focal abnormality visualized. Right ovary Measurements: 2.7 x 2.2 x 2.3 cm = volume: 7.3 mL. Normal appearance/no adnexal mass. Small follicles. Left ovary Measurements: 3.0 x 2.5 x 2.4 cm = volume: 9.3 mL. Normal appearance/no adnexal mass. Small follicles. Other findings Small a moderate amount of free pelvic fluid. Pelvic varicosities noted. Doppler ultrasound ovaries reveals normal bilateral arterial and venous flow. IMPRESSION: 1.  Pelvic varicosities noted. 2.  Small to moderate amount free pelvic fluid. 3.  Exam otherwise unremarkable.  No evidence of ovarian torsion. Electronically Signed   By: Maisie Fus  Register   On: 11/18/2018 15:12   [place] Art/ven Flow Abd Pelv Doppler  Result Date: 11/18/2018 CLINICAL DATA:  Adnexal tenderness to palpation.  Vaginal bleeding. EXAM: TRANSABDOMINAL AND TRANSVAGINAL ULTRASOUND OF PELVIS DOPPLER ULTRASOUND OF OVARIES TECHNIQUE: Both transabdominal and transvaginal ultrasound examinations of the pelvis were performed. Transabdominal technique was performed for global imaging of the  pelvis including uterus, ovaries, adnexal regions, and pelvic cul-de-sac. It was necessary to proceed with endovaginal exam following the transabdominal exam to visualize the uterus and ovaries. Color and duplex Doppler ultrasound was utilized to evaluate blood flow to the ovaries. None. CT 08/27/2017. FINDINGS: Uterus Measurements: 8.9 x 4.1 x 5.4 cm = volume: 104.3 mL. No fibroids or other mass visualized. Endometrium Thickness: 5 mm.  No focal abnormality visualized. Right ovary Measurements: 2.7 x 2.2 x 2.3 cm = volume: 7.3 mL. Normal appearance/no adnexal mass. Small follicles. Left ovary Measurements: 3.0 x 2.5 x 2.4 cm = volume: 9.3 mL. Normal appearance/no adnexal mass. Small follicles. Other findings Small a moderate amount of free pelvic fluid. Pelvic varicosities noted. Doppler ultrasound ovaries reveals normal bilateral arterial and venous flow. IMPRESSION: 1.  Pelvic varicosities noted. 2.  Small  to moderate amount free pelvic fluid. 3.  Exam otherwise unremarkable.  No evidence of ovarian torsion. Electronically Signed   By: Maisie Fus  Register   On: 11/18/2018 15:12    Procedures Procedures (including critical care time)  Medications Ordered in ED Medications - No data to display   Initial Impression / Assessment and Plan / ED Course  I have reviewed the triage vital signs and the nursing notes.  Pertinent labs & imaging results that were available during my care of the patient were reviewed by me and considered in my medical decision making (see chart for details).     Final Clinical Impressions(s) / ED Diagnoses   Final diagnoses:  Vaginal bleeding   Patient presenting with 3-day history of vaginal bleeding.  Reports that she recently just had her menstrual cycle and that it is abnormal for her to have bleeding between her menses.  Also reporting some lower abdominal cramping and nausea.  No vomiting or other GI symptoms.  On exam has mild lower abd ttp. On pelvic exam there is a  moderate amount of blood, with uterine and left adnexal ttp. No cmt, but cervical os appears to be open.   Cbc with mild anemia at 11.9, decreased from 1 month ago but appears to be somewhat consistent to baseline. No leukocytosis. Bmp with normal electrolytes. Beta hcg negative. ua with hematuria, and bacteriuria but no leukocytes or nitrites to suggest infection. Wet prep with wbc, no clue cells.   Pelvic ultrasound with pelvic varicosities noted, there was also a small to moderate amount of free pelvic fluid noted. No evidence of ovarian torsion.  pts vital signs have remained stable in the ed. I have recommended that she take over the counter antiinflammatories and follow up with ob-gyn in regards to her symptoms. Return precautions discussed. Pt voices understanding of the plan and reasons to return. All questions answered.   ED Discharge Orders    None       Rayne Du 11/18/18 1526    Gwyneth Sprout, MD 11/18/18 1846

## 2018-11-18 NOTE — ED Triage Notes (Signed)
Pt reports abnormal vaginal bleeding.  Pt states she is passing clots and going through 2 pads an hour.  Pt denies pregnancy.  Pt unable to tell if there discharge through the blood.  Pt reports difficulty urinating denies burning, urgency or frequency.

## 2018-11-21 LAB — GC/CHLAMYDIA PROBE AMP (~~LOC~~) NOT AT ARMC
CHLAMYDIA, DNA PROBE: NEGATIVE
NEISSERIA GONORRHEA: NEGATIVE

## 2019-03-30 IMAGING — US US ART/VEN ABD/PELV/SCROTUM DOPPLER LTD
1 series · 13 of 25 positions shown · non-contrast
Comparison: none

CLINICAL DATA: Adnexal tenderness to palpation.  Vaginal bleeding.

EXAM:
TRANSABDOMINAL AND TRANSVAGINAL ULTRASOUND OF PELVIS
DOPPLER ULTRASOUND OF OVARIES
TECHNIQUE: Both transabdominal and transvaginal ultrasound examinations of the
pelvis were performed. Transabdominal technique was performed for
global imaging of the pelvis including uterus, ovaries, adnexal
regions, and pelvic cul-de-sac.
It was necessary to proceed with endovaginal exam following the
transabdominal exam to visualize the uterus and ovaries. Color and
duplex Doppler ultrasound was utilized to evaluate blood flow to the
ovaries.
None.
CT 08/27/2017.

[Series 1: us art/ven abd/pelv/scrotum doppler ltd · 0.21mm/px · 13 of 93 slices shown]
[im 1/93]
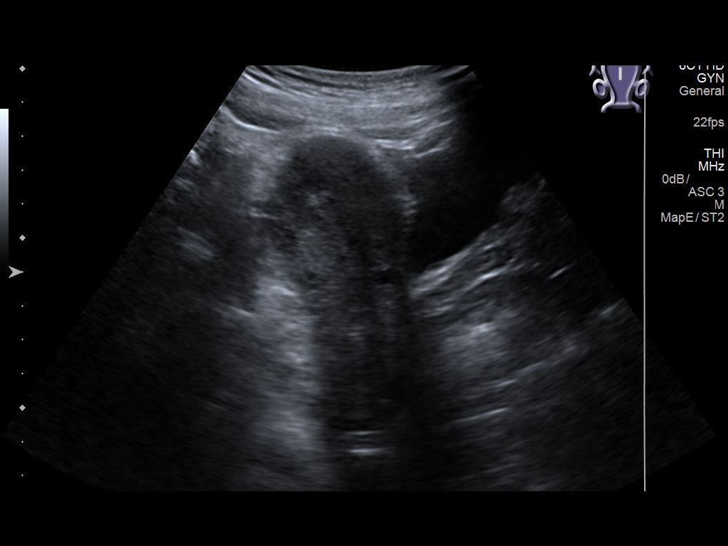
[im 8/93]
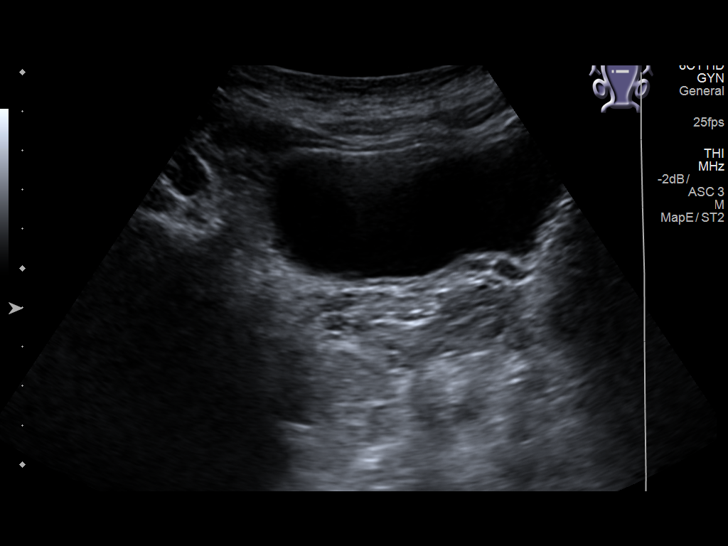
[im 16/93]
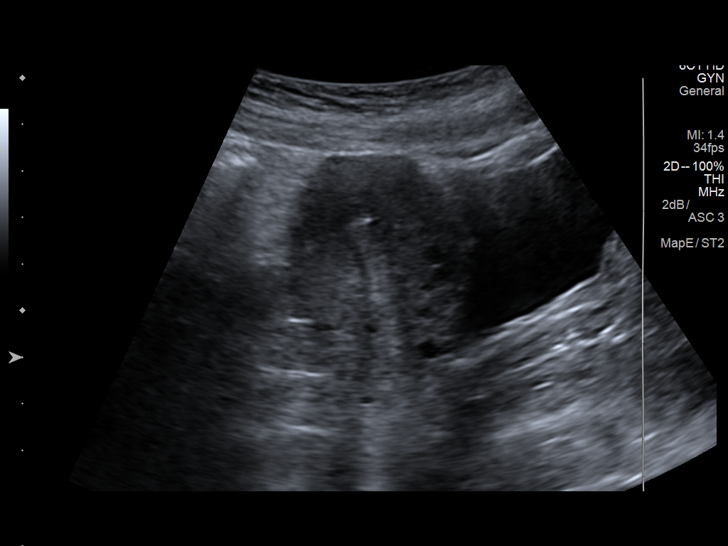
[im 24/93]
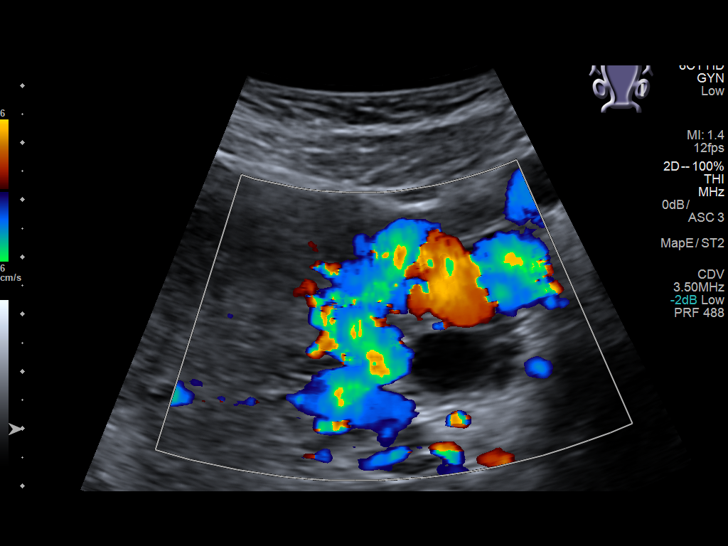
[im 31/93]
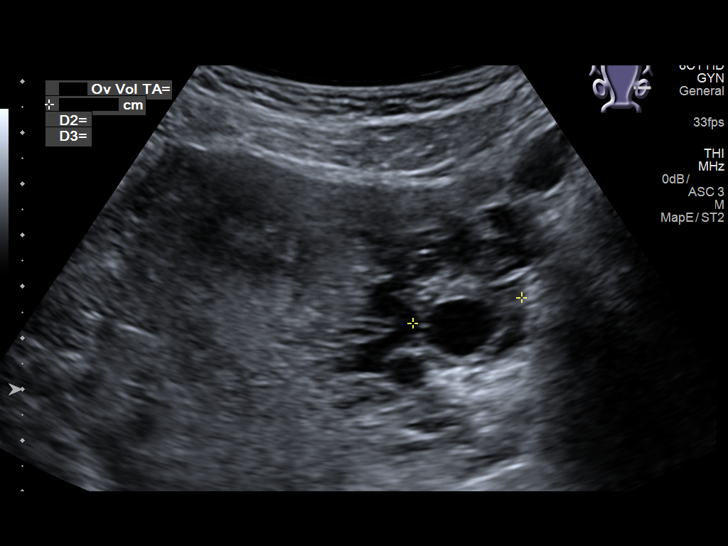
[im 39/93]
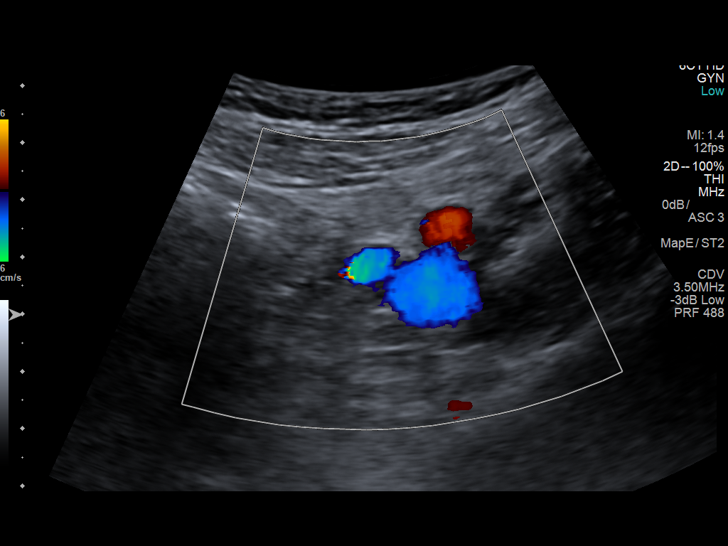
[im 47/93]
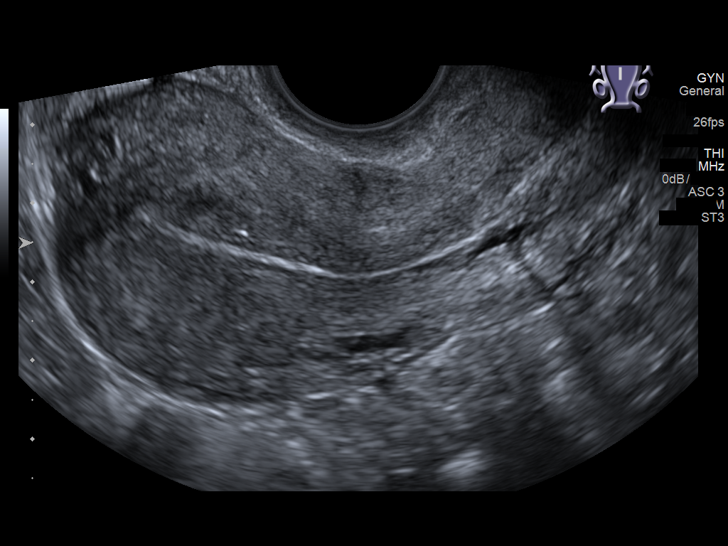
[im 54/93]
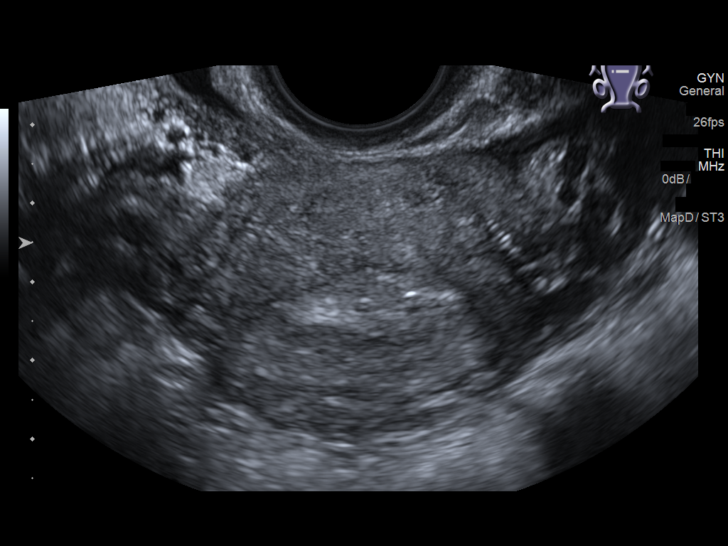
[im 62/93]
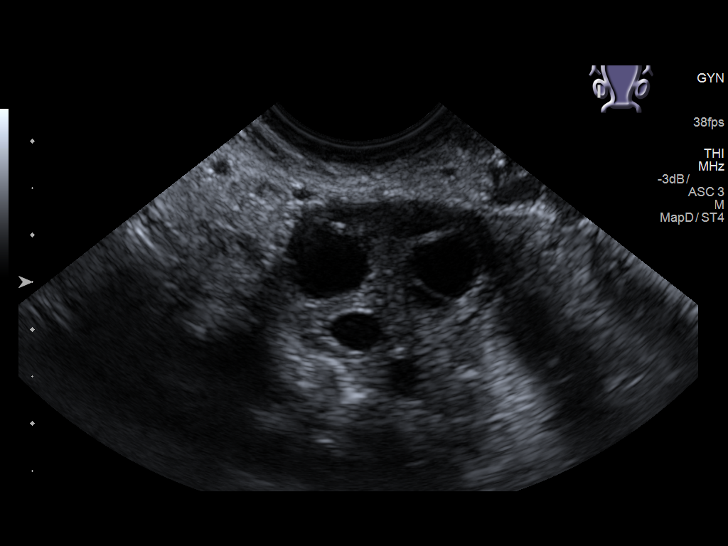
[im 70/93]
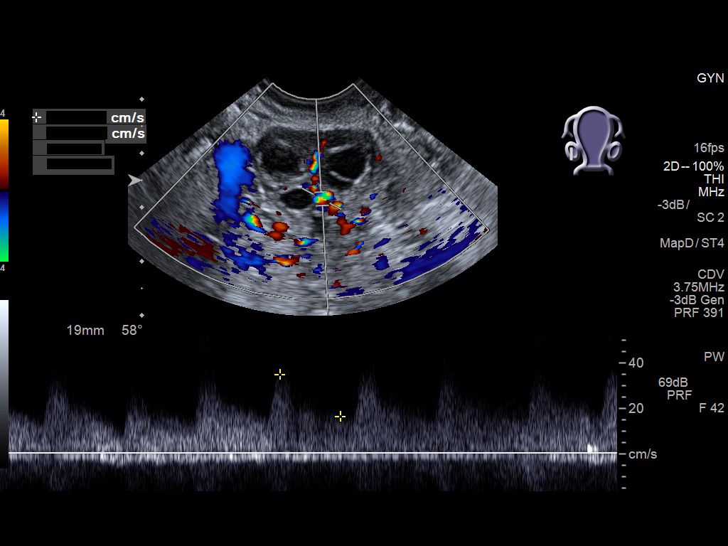
[im 77/93]
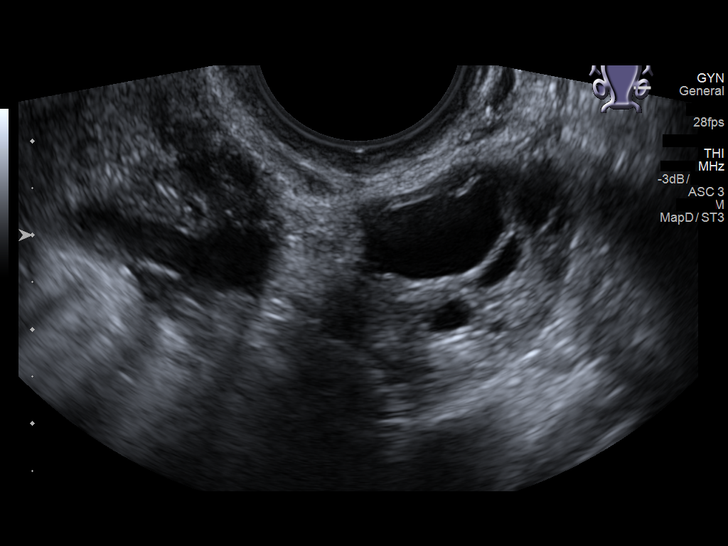
[im 85/93]
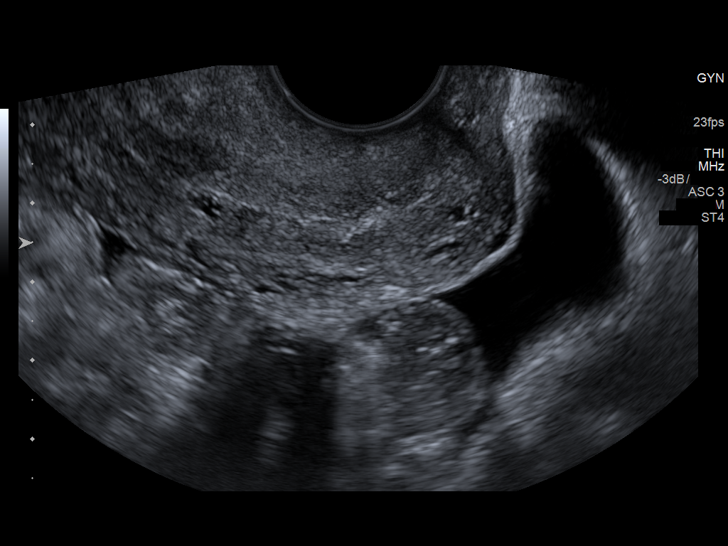
[im 93/93]
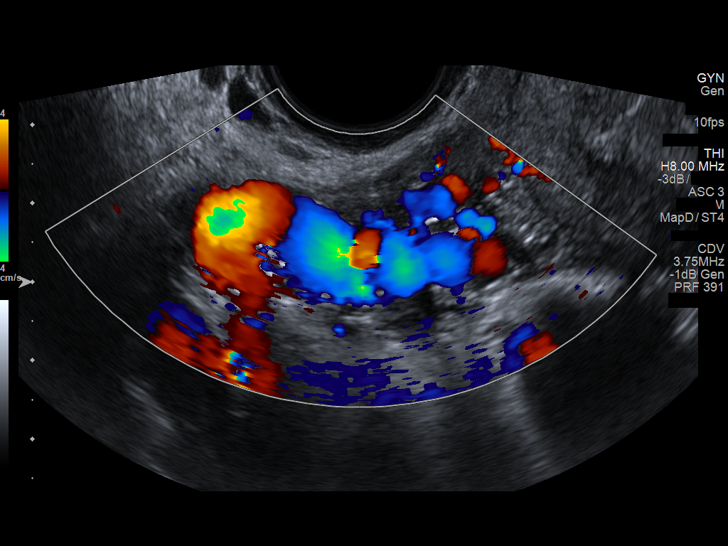

[13 of 25 positions shown; findings below may reference images not displayed]

FINDINGS: Uterus

Measurements: 8.9 x 4.1 x 5.4 cm = volume: 104.3 mL. No fibroids or
other mass visualized.

Endometrium

Thickness: 5 mm.  No focal abnormality visualized.

Right ovary

Measurements: 2.7 x 2.2 x 2.3 cm = volume: 7.3 mL. Normal
appearance/no adnexal mass. Small follicles.

Left ovary

Measurements: 3.0 x 2.5 x 2.4 cm = volume: 9.3 mL. Normal
appearance/no adnexal mass. Small follicles.

Other findings

Small a moderate amount of free pelvic fluid. Pelvic varicosities
noted.

Doppler ultrasound ovaries reveals normal bilateral arterial and
venous flow.
IMPRESSION: 1.  Pelvic varicosities noted.

2.  Small to moderate amount free pelvic fluid.

3.  Exam otherwise unremarkable.  No evidence of ovarian torsion.

## 2019-12-14 ENCOUNTER — Encounter (HOSPITAL_COMMUNITY): Payer: Self-pay | Admitting: Emergency Medicine

## 2019-12-14 ENCOUNTER — Other Ambulatory Visit: Payer: Self-pay

## 2019-12-14 ENCOUNTER — Emergency Department (HOSPITAL_COMMUNITY): Payer: Medicaid Other

## 2019-12-14 ENCOUNTER — Emergency Department (HOSPITAL_COMMUNITY)
Admission: EM | Admit: 2019-12-14 | Discharge: 2019-12-14 | Disposition: A | Discharge status: Home / Self Care | Payer: Medicaid Other | Attending: Emergency Medicine | Admitting: Emergency Medicine

## 2019-12-14 DIAGNOSIS — Z79899 Other long term (current) drug therapy: Secondary | ICD-10-CM | POA: Insufficient documentation

## 2019-12-14 DIAGNOSIS — M5432 Sciatica, left side: Secondary | ICD-10-CM | POA: Diagnosis not present

## 2019-12-14 DIAGNOSIS — M25552 Pain in left hip: Secondary | ICD-10-CM | POA: Diagnosis present

## 2019-12-14 LAB — I-STAT BETA HCG BLOOD, ED (MC, WL, AP ONLY): I-stat hCG, quantitative: <5 m[IU]/mL (ref <5)

## 2019-12-14 MED ORDER — METHOCARBAMOL 500 MG PO TABS
500.0000 mg | ORAL_TABLET | Freq: Once | ORAL | Status: AC
  Administered 2019-12-14: 18:00:00 500 mg via ORAL
  Filled 2019-12-14: qty 1

## 2019-12-14 MED ORDER — KETOROLAC TROMETHAMINE 30 MG/ML IJ SOLN
30.0000 mg | Freq: Once | INTRAMUSCULAR | Status: AC
  Administered 2019-12-14: 30 mg via INTRAMUSCULAR
  Filled 2019-12-14: qty 1

## 2019-12-14 MED ORDER — NAPROXEN 500 MG PO TABS: 500.0000 mg | ORAL_TABLET | Freq: Two times a day (BID) | ORAL | 0 refills | Status: DC

## 2019-12-14 MED ORDER — METHOCARBAMOL 500 MG PO TABS: 500.0000 mg | ORAL_TABLET | Freq: Two times a day (BID) | ORAL | 0 refills | Status: DC

## 2019-12-14 NOTE — ED Triage Notes (Signed)
Pt c/o left hip pain that radiates down her leg and her back x 2 weeks. Denies injury, ambulatory without difficulty.

## 2019-12-14 NOTE — Discharge Instructions (Addendum)
Take the medications as needed to help with your symptoms. Heat, stretching massage will also help with your muscle pain. Return to the ED if you start to experience worsening symptoms, losing control of your bowels or bladder, chest pain, shortness of breath, injuries or falls.

## 2019-12-14 NOTE — ED Notes (Signed)
Patient returned from CT

## 2019-12-14 NOTE — ED Notes (Signed)
Patient transported to X-ray 

## 2019-12-14 NOTE — ED Provider Notes (Signed)
[facility] Provider [facility]   CSN: [id number] Arrival date & time: 12/14/19  1634     History Chief Complaint  Patient presents with  . Hip Pain    Donna Burtonmily Rosemond is a 23 y.o. female with a past medical history of DDD, presenting to the ED with a chief complaint of left-sided lower back pain.  Symptoms have been going on for the past 2 weeks but got worse today as [place].  She has tried ibuprofen with minimal improvement in her symptoms.  She reports pain in her lower back in the past but it has never radiated to her left hip like it is today.  She cannot recall an inciting event 2 weeks ago that may have triggered the symptoms.  She denies any loss of bowel or bladder function, prior back surgeries, history of cancer, history of IV drug use, shortness of breath, chest pain possibility of pregnancy, abdominal pain.  She is not currently breast-feeding.  HPI     Past Medical History:  Diagnosis Date  . Degenerative disc disease   . Herniated disc     Patient Active Problem List   Diagnosis Date Noted  . Gestational hypertension 03/26/2017  . Normal labor 03/25/2017  . Low back pain 03/28/2013    History reviewed. No pertinent surgical history.   OB History    Gravida  1   Para  1   Term  1   Preterm  0   AB  0   Living  1     SAB  0   TAB  0   Ectopic  0   Multiple  0   Live Births  1           No family history on file.  Social History   Tobacco Use  . Smoking status: Never Smoker  . Smokeless tobacco: Never Used  Substance Use Topics  . Alcohol use: No  . Drug use: Never    Home Medications Prior to Admission medications   Medication Sig Start Date End Date Taking? Authorizing Provider  ciprofloxacin (CIPRO) 500 MG tablet Take 1 tablet (500 mg total) by mouth 2 (two) times daily. 10/05/18   Devoria AlbeKnapp, Iva, MD  ibuprofen (ADVIL,MOTRIN) 400 MG  tablet Take 1 tablet (400 mg total) by mouth every 6 (six) hours as needed. 08/27/17   Ivery QualeBryant, Hobson, PA-C  methocarbamol (ROBAXIN) 500 MG tablet Take 1 tablet (500 mg total) by mouth 2 (two) times daily. 12/14/19   Stylianos Stradling, PA-C  naproxen (NAPROSYN) 500 MG tablet Take 1 tablet (500 mg total) by mouth 2 (two) times daily. 12/14/19   Arionne Iams, PA-C  ondansetron (ZOFRAN ODT) 4 MG disintegrating tablet 4mg  ODT q4 hours prn nausea/vomit Patient taking differently: Take 4 mg by mouth every 8 (eight) hours as needed for nausea or vomiting.  08/25/16   Blane OharaZavitz, Joshua, MD  ondansetron (ZOFRAN) 4 MG tablet Take 1 tablet (4 mg total) by mouth every 8 (eight) hours as needed for nausea or vomiting. 10/05/18   Devoria AlbeKnapp, Iva, MD  Prenatal Vit-Fe Fumarate-FA [facility](GNP PRENATAL VITAMINS) 28-0.8 MG TABS Take 1 tablet by mouth daily. Patient taking differently: Take 1 tablet by mouth daily. Gummies 08/03/16   Zadie RhineWickline, Donald, MD    Allergies    Ultram [tramadol] and Augmentin [amoxicillin-pot clavulanate]  Review of Systems   Review of Systems  Constitutional: Negative for chills and fever.  Musculoskeletal: Positive for back pain and myalgias.  Skin: Negative for wound.  Neurological: Negative for weakness and numbness.    Physical Exam Updated Vital Signs BP 115/73 (BP Location: Right Arm)   Pulse 81   Temp 99.2 F (37.3 C) (Oral)   Resp 17   Ht 5\' 3"  (1.6 m)   Wt 56.7 kg   LMP 11/30/2019   SpO2 98%   BMI 22.14 kg/m   Physical Exam Vitals and nursing note reviewed.  Constitutional:      General: She is not in acute distress.    Appearance: She is well-developed. She is not diaphoretic.  HENT:     Head: Normocephalic and atraumatic.  Eyes:     General: No scleral icterus.    Conjunctiva/sclera: Conjunctivae normal.  Cardiovascular:     Rate and Rhythm: Normal rate and regular rhythm.     Heart sounds: Normal heart sounds.  Pulmonary:     Effort: Pulmonary effort is normal. No  respiratory distress.     Breath sounds: Normal breath sounds.  Musculoskeletal:     Cervical back: Normal range of motion. Tenderness present. No bony tenderness.       Back:     Comments: TTP of the L paraspinal musculature of the lumbar spine as well as the left hip without changes to range of motion of the left hip. No midline spinal tenderness present in lumbar, thoracic or cervical spine. No step-off palpated. No visible bruising, edema or temperature change noted. No objective signs of numbness present. No saddle anesthesia. 2+ DP pulses bilaterally. Sensation intact to light touch. Strength 5/5 in bilateral lower extremities.  Skin:    Findings: No rash.  Neurological:     Mental Status: She is alert.     ED Results / Procedures / Treatments   Labs (all labs ordered are listed, but only abnormal results are displayed) Labs Reviewed  I-STAT BETA HCG BLOOD, ED ([facility], [facility], [facility] ONLY)    EKG None  Radiology DG Hip Unilat W or Wo Pelvis 2-3 Views Left  Result Date: 12/14/2019 CLINICAL DATA:  Posterior left hip pain for 3 weeks. No known injury. EXAM: DG HIP (WITH OR WITHOUT PELVIS) 2-3V LEFT COMPARISON:  None FINDINGS: There is no evidence of hip fracture or dislocation. There is no evidence of arthropathy or other focal bone abnormality. IMPRESSION: Negative. Electronically Signed   By: Rolm Baptise M.D.   On: 12/14/2019 19:57    Procedures Procedures (including critical care time)  Medications Ordered in ED Medications  ketorolac (TORADOL) 30 MG/ML injection 30 mg (30 mg Intramuscular Given 12/14/19 1745)  methocarbamol (ROBAXIN) tablet 500 mg (500 mg Oral Given 12/14/19 1744)    ED Course  I have reviewed the triage vital signs and the nursing notes.  Pertinent labs & imaging results that were available during my care of the patient were reviewed by me and considered in my medical decision making (see chart for details).    MDM Rules/Calculators/A&P                        Patient denies any concerning symptoms suggestive of cauda equina requiring urgent imaging at this time such as loss of sensation in the lower extremities, lower extremity weakness, loss of bowel or bladder control, saddle anesthesia, urinary retention, fever/chills, IVDU. Exam demonstrated no  weakness on exam today. No preceding injury or trauma to suggest acute fracture. Doubt pelvic or urinary pathology for patient's acute  back pain, as patient denies urinary symptoms, has no CVA tenderness, history/pain not consistent with nephrolithiasis, has no vaginal discharge, and is not pregnant as evidenced by negative POC urine pregnancy test. Doubt AAA as cause of patient's back pain as patient lacks  major risk factors, had no abdominal TTP, and has symmetric and intact distal pulses.  X-rays here are unremarkable.  She has had significant improvement with NSAIDs and muscle relaxer.  Will discharge with same, as symptoms are most likely due to lumbar strain with sciatica.  Patient given strict return precautions for any symptoms indicating worsening neurologic function in the lower extremities.  Patient is hemodynamically stable, in NAD, and able to ambulate in the ED. Evaluation does not show pathology that would require ongoing emergent intervention or inpatient treatment. I explained the diagnosis to the patient. Pain has been managed and has no complaints prior to discharge. Patient is comfortable with above plan and is stable for discharge at this time. All questions were answered prior to disposition. Strict return precautions for returning to the ED were discussed. Encouraged follow up with PCP.   An After Visit Summary was printed and given to the patient.   Portions of this note were generated with Scientist, clinical (histocompatibility and immunogenetics). Dictation errors may occur despite best attempts at proofreading.  Final Clinical Impression(s) / ED Diagnoses Final diagnoses:  Sciatica of left side    Rx / DC  Orders ED Discharge Orders         Ordered    methocarbamol (ROBAXIN) 500 MG tablet  2 times daily     12/14/19 2024    naproxen (NAPROSYN) 500 MG tablet  2 times daily     12/14/19 2024           Dietrich Pates, PA-C 12/14/19 2025    Glynn Octave, MD 12/14/19 2128

## 2020-01-05 IMAGING — CR DG HIP (WITH OR WITHOUT PELVIS) 2-3V*L*
3 series · 3 of 3 positions shown · non-contrast
Comparison: None

CLINICAL DATA: Posterior left hip pain for 3 weeks. No known
injury.

EXAM:
DG HIP (WITH OR WITHOUT PELVIS) 2-3V LEFT

[pelvis ap]
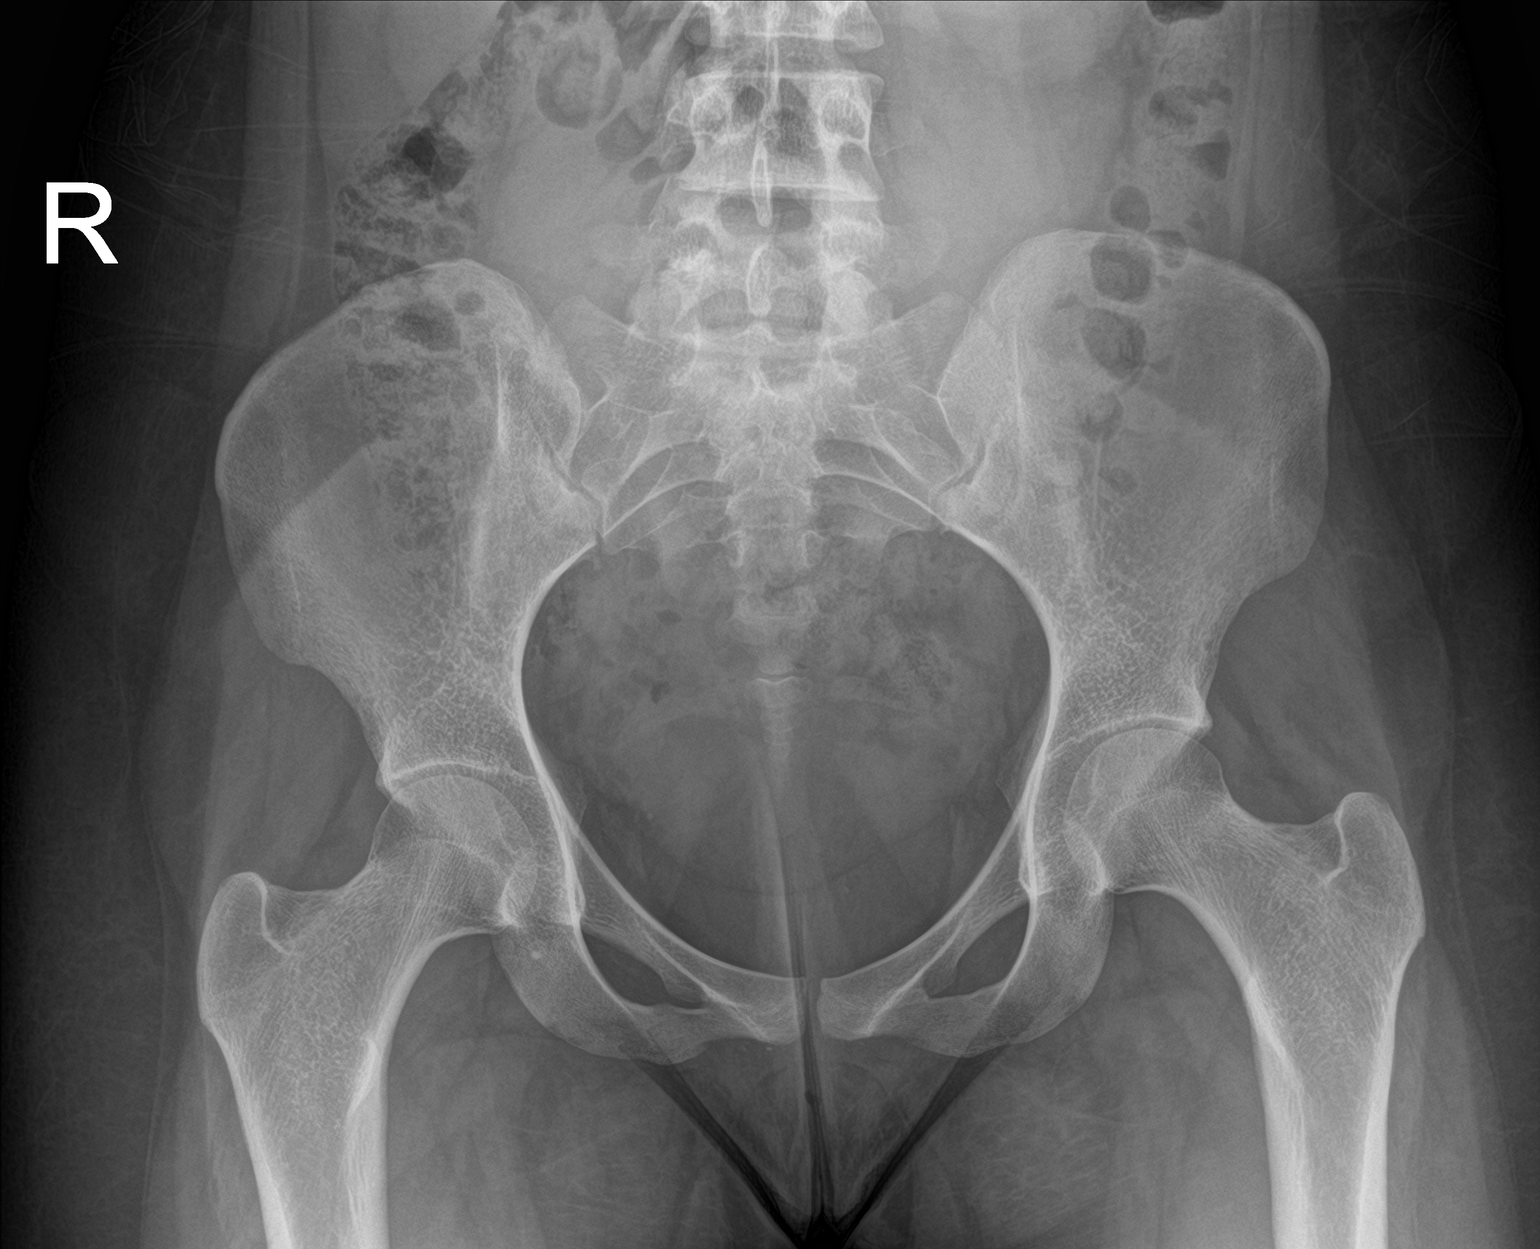

[hip ap]
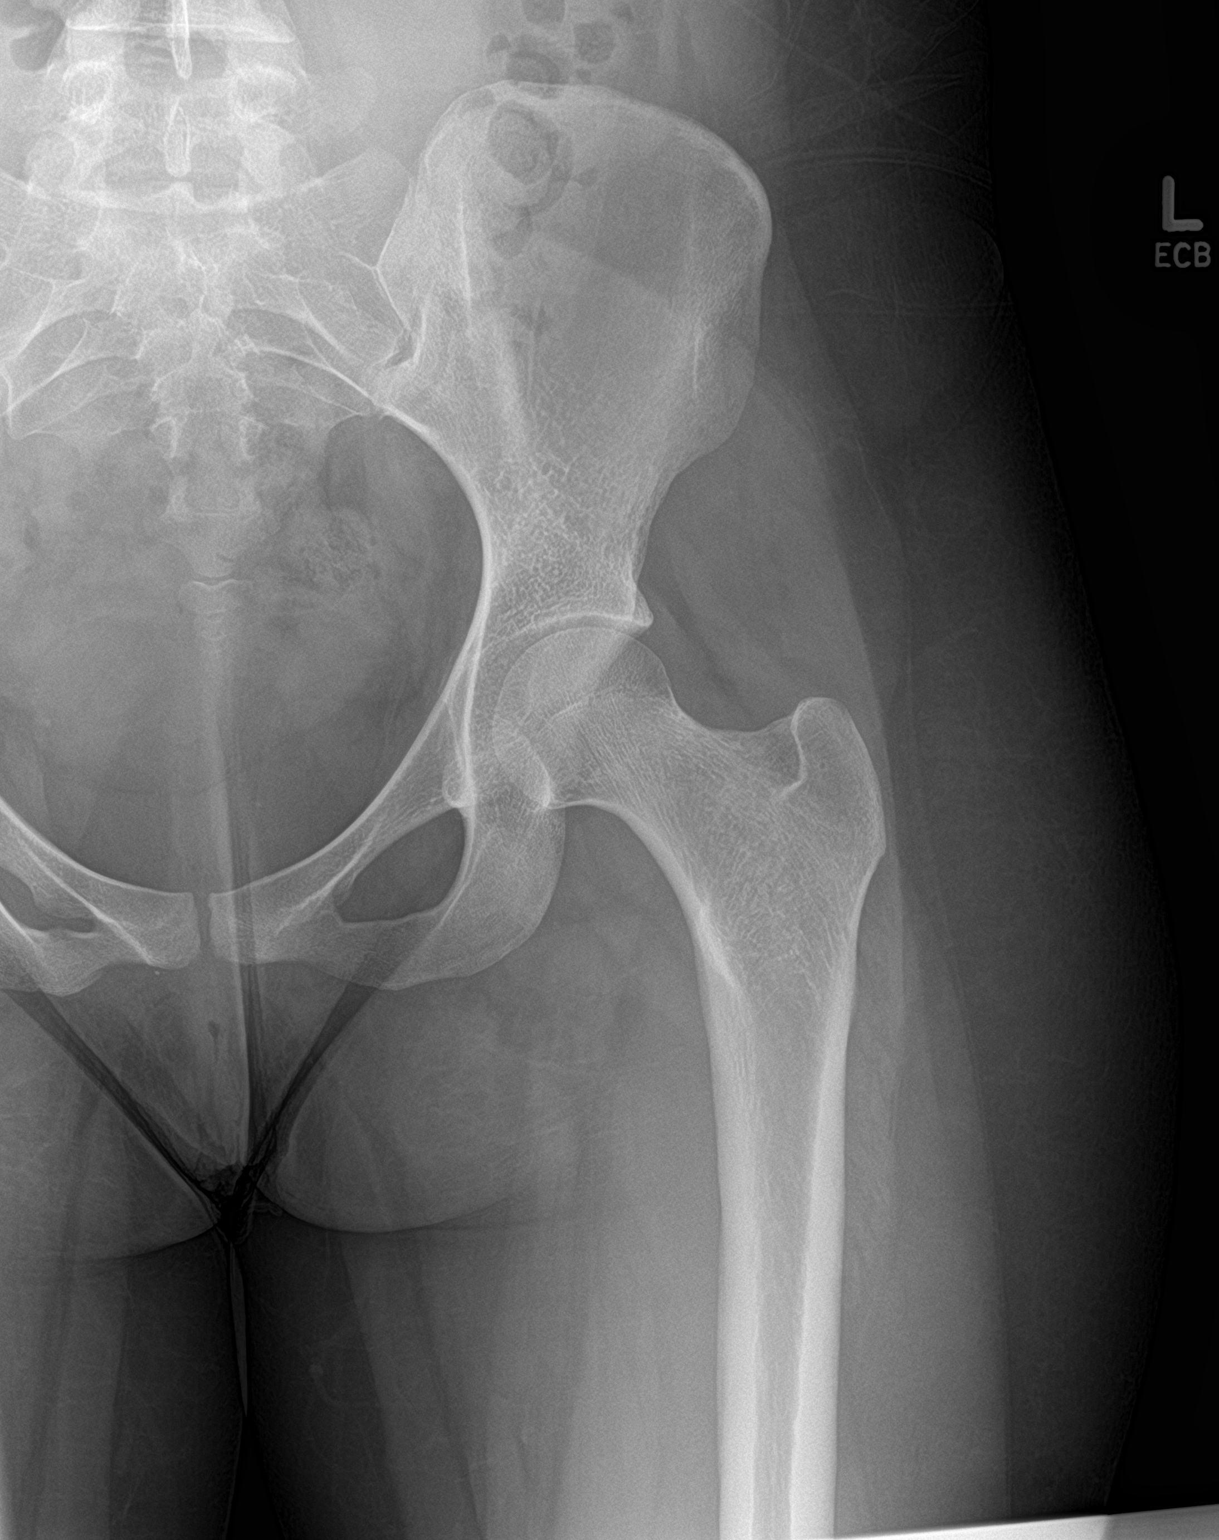

[hip lat]
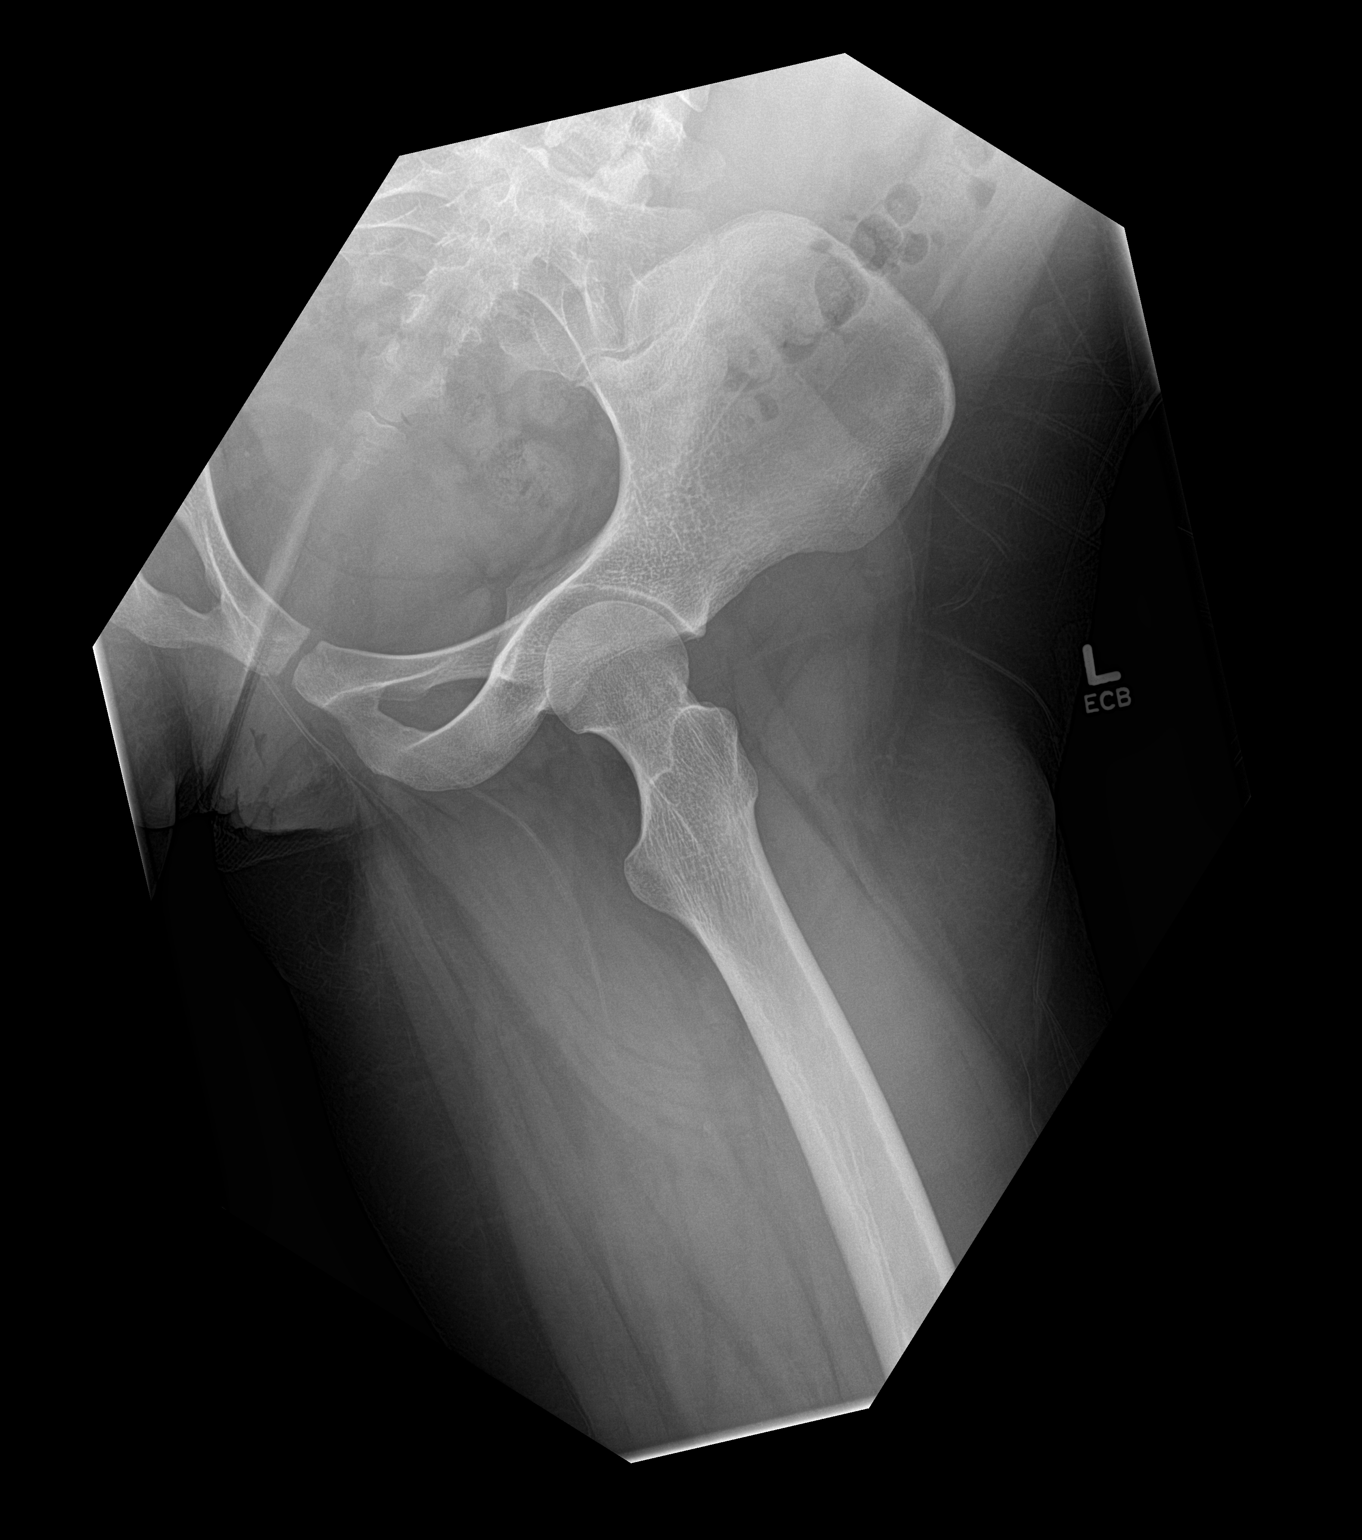

[3 of 3 positions shown; findings below may reference images not displayed]

FINDINGS: There is no evidence of hip fracture or dislocation. There is no
evidence of arthropathy or other focal bone abnormality.
IMPRESSION: Negative.

## 2020-03-25 ENCOUNTER — Ambulatory Visit: Payer: Medicaid Other

## 2021-01-22 ENCOUNTER — Encounter (HOSPITAL_COMMUNITY): Payer: Self-pay | Admitting: Emergency Medicine

## 2021-01-22 ENCOUNTER — Ambulatory Visit (HOSPITAL_COMMUNITY)
Admission: EM | Admit: 2021-01-22 | Discharge: 2021-01-23 | Disposition: A | Discharge status: Home / Self Care | Payer: Medicaid Other | Attending: General Surgery | Admitting: General Surgery

## 2021-01-22 ENCOUNTER — Other Ambulatory Visit: Payer: Self-pay

## 2021-01-22 ENCOUNTER — Emergency Department (HOSPITAL_COMMUNITY): Payer: Medicaid Other

## 2021-01-22 DIAGNOSIS — R1031 Right lower quadrant pain: Secondary | ICD-10-CM | POA: Insufficient documentation

## 2021-01-22 DIAGNOSIS — Z881 Allergy status to other antibiotic agents status: Secondary | ICD-10-CM | POA: Diagnosis not present

## 2021-01-22 DIAGNOSIS — Z8739 Personal history of other diseases of the musculoskeletal system and connective tissue: Secondary | ICD-10-CM

## 2021-01-22 DIAGNOSIS — Z888 Allergy status to other drugs, medicaments and biological substances status: Secondary | ICD-10-CM | POA: Insufficient documentation

## 2021-01-22 DIAGNOSIS — R109 Unspecified abdominal pain: Secondary | ICD-10-CM

## 2021-01-22 DIAGNOSIS — Z79899 Other long term (current) drug therapy: Secondary | ICD-10-CM | POA: Diagnosis not present

## 2021-01-22 DIAGNOSIS — Z20822 Contact with and (suspected) exposure to covid-19: Secondary | ICD-10-CM | POA: Diagnosis not present

## 2021-01-22 DIAGNOSIS — R112 Nausea with vomiting, unspecified: Secondary | ICD-10-CM | POA: Diagnosis not present

## 2021-01-22 DIAGNOSIS — K37 Unspecified appendicitis: Secondary | ICD-10-CM | POA: Diagnosis present

## 2021-01-22 DIAGNOSIS — K3580 Unspecified acute appendicitis: Secondary | ICD-10-CM | POA: Diagnosis not present

## 2021-01-22 HISTORY — DX: Sciatica, unspecified side: M54.30

## 2021-01-22 LAB — COMPREHENSIVE METABOLIC PANEL
ALT: 13 U/L (ref 0–44)
AST: 14 U/L — ABNORMAL LOW (ref 15–41)
Albumin: 4 g/dL (ref 3.5–5.0)
Alkaline Phosphatase: 80 U/L (ref 38–126)
Anion gap: 6 (ref 5–15)
BUN: 9 mg/dL (ref 6–20)
CO2: 26 mmol/L (ref 22–32)
Calcium: 9.2 mg/dL (ref 8.9–10.3)
Chloride: 104 mmol/L (ref 98–111)
Creatinine, Ser: 0.54 mg/dL (ref 0.44–1.00)
GFR, Estimated: >60 mL/min (ref >60)
Glucose, Bld: 90 mg/dL (ref 70–99)
Potassium: 3.7 mmol/L (ref 3.5–5.1)
Sodium: 136 mmol/L (ref 135–145)
Total Bilirubin: 0.9 mg/dL (ref 0.3–1.2)
Total Protein: 7 g/dL (ref 6.5–8.1)

## 2021-01-22 LAB — URINALYSIS, ROUTINE W REFLEX MICROSCOPIC
Bilirubin Urine: NEGATIVE
Glucose, UA: NEGATIVE mg/dL
Hgb urine dipstick: NEGATIVE
Ketones, ur: NEGATIVE mg/dL
Leukocytes,Ua: NEGATIVE
Nitrite: NEGATIVE
Protein, ur: NEGATIVE mg/dL
Specific Gravity, Urine: 1.023 (ref 1.005–1.030)
pH: 5 (ref 5.0–8.0)

## 2021-01-22 LAB — CBC
HCT: 35.4 % — ABNORMAL LOW (ref 36.0–46.0)
Hemoglobin: 12.5 g/dL (ref 12.0–15.0)
MCH: 30 pg (ref 26.0–34.0)
MCHC: 35.3 g/dL (ref 30.0–36.0)
MCV: 84.9 fL (ref 80.0–100.0)
Platelets: 279 10*3/uL (ref 150–400)
RBC: 4.17 MIL/uL (ref 3.87–5.11)
RDW: 12.8 % (ref 11.5–15.5)
WBC: 7.2 10*3/uL (ref 4.0–10.5)
nRBC: 0 % (ref 0.0–0.2)

## 2021-01-22 LAB — WET PREP, GENITAL
Sperm: NONE SEEN
Trich, Wet Prep: NONE SEEN
Yeast Wet Prep HPF POC: NONE SEEN

## 2021-01-22 LAB — LIPASE, BLOOD: Lipase: 17 U/L (ref 11–51)

## 2021-01-22 LAB — POC URINE PREG, ED: Preg Test, Ur: NEGATIVE

## 2021-01-22 MED ORDER — SODIUM CHLORIDE 0.9 % IV SOLN: Freq: Once | INTRAVENOUS | Status: AC

## 2021-01-22 MED ORDER — DIPHENHYDRAMINE HCL 50 MG/ML IJ SOLN
25.0000 mg | Freq: Once | INTRAMUSCULAR | Status: AC
  Administered 2021-01-22: 25 mg via INTRAVENOUS
  Filled 2021-01-22: qty 1

## 2021-01-22 MED ORDER — KETOROLAC TROMETHAMINE 15 MG/ML IJ SOLN: 15.0000 mg | Freq: Four times a day (QID) | INTRAMUSCULAR | Status: DC | PRN

## 2021-01-22 MED ORDER — ONDANSETRON HCL 4 MG/2ML IJ SOLN: 4.0000 mg | Freq: Four times a day (QID) | INTRAMUSCULAR | Status: DC | PRN

## 2021-01-22 MED ORDER — CIPROFLOXACIN IN D5W 400 MG/200ML IV SOLN
400.0000 mg | Freq: Once | INTRAVENOUS | Status: AC
  Administered 2021-01-22: 400 mg via INTRAVENOUS
  Filled 2021-01-22: qty 200

## 2021-01-22 MED ORDER — IOHEXOL 300 MG/ML  SOLN
100.0000 mL | Freq: Once | INTRAMUSCULAR | Status: AC | PRN
  Administered 2021-01-22: 100 mL via INTRAVENOUS

## 2021-01-22 MED ORDER — METRONIDAZOLE IN NACL 5-0.79 MG/ML-% IV SOLN
500.0000 mg | Freq: Once | INTRAVENOUS | Status: AC
  Administered 2021-01-22: 500 mg via INTRAVENOUS
  Filled 2021-01-22: qty 100

## 2021-01-22 NOTE — ED Triage Notes (Signed)
Pt c/o RLQ abdominal pain x 3 days with N/V and chills.  Pt saw her PCP today and they advised her to come to the ED for a CT to R/O appendicitis.

## 2021-01-22 NOTE — ED Notes (Signed)
Pt in bed, pt c/o redness and itching at iv site, stopped abx, iv flush and has positive blood return, no swelling noted, pt appears to be having an allergic reaction, md notified.

## 2021-01-22 NOTE — ED Provider Notes (Signed)
[facility] EMERGENCY DEPARTMENT Provider Note   CSN: [id number] Arrival date & time: 01/22/21  1252     History Chief Complaint  Patient presents with  . Abdominal Pain    Donna Reeves is a 25 y.o. female.  HPI   This patient is a 25 year old female, no prior abdominal surgical history.  She does have a history of chlamydia approximately 5 years ago.  She reports that over the last several days she has had some worsening pain in the lower abdomen which started in the suprapubic region and is now radiated to the right lower quadrant as well as the right flank.  It seems to gets worse when she urinates but there is no associated fevers, hematuria or dysuria.  She has no prior history of urinary tract infections or pyelonephritis, no history of appendicitis gallbladder disease ovarian cysts or significant abscesses.  She has never had a hernia.  She was sent here by her family doctor to have evaluation for appendicitis.  The patient is not pregnant, she does have a vaginal discharge which is very wet, new to the last several weeks, does not have a foul odor.  She is with her husband only for the last year.  Patient denies fevers or chills coughing or shortness of breath and has no other symptoms.  Past Medical History:  Diagnosis Date  . Degenerative disc disease   . Herniated disc   . Sciatica     Patient Active Problem List   Diagnosis Date Noted  . Gestational hypertension 03/26/2017  . Normal labor 03/25/2017  . Low back pain 03/28/2013    History reviewed. No pertinent surgical history.   OB History    Gravida  1   Para  1   Term  1   Preterm  0   AB  0   Living  1     SAB  0   IAB  0   Ectopic  0   Multiple  0   Live Births  1           History reviewed. No pertinent family history.  Social History   Tobacco Use  . Smoking status: Never Smoker  . Smokeless tobacco: Never Used  Vaping Use  . Vaping Use: Never used  Substance Use  Topics  . Alcohol use: No  . Drug use: Never    Home Medications Prior to Admission medications   Medication Sig Start Date End Date Taking? Authorizing Provider  ciprofloxacin (CIPRO) 500 MG tablet Take 1 tablet (500 mg total) by mouth 2 (two) times daily. 10/05/18   Devoria Albe, MD  ibuprofen (ADVIL,MOTRIN) 400 MG tablet Take 1 tablet (400 mg total) by mouth every 6 (six) hours as needed. 08/27/17   Ivery Quale, PA-C  methocarbamol (ROBAXIN) 500 MG tablet Take 1 tablet (500 mg total) by mouth 2 (two) times daily. 12/14/19   Khatri, Hina, PA-C  naproxen (NAPROSYN) 500 MG tablet Take 1 tablet (500 mg total) by mouth 2 (two) times daily. 12/14/19   Khatri, Hina, PA-C  ondansetron (ZOFRAN ODT) 4 MG disintegrating tablet 4mg  ODT q4 hours prn nausea/vomit Patient taking differently: Take 4 mg by mouth every 8 (eight) hours as needed for nausea or vomiting.  08/25/16   08/27/16, MD  ondansetron (ZOFRAN) 4 MG tablet Take 1 tablet (4 mg total) by mouth every 8 (eight) hours as needed for nausea or vomiting. 10/05/18   12/05/18, MD  Prenatal Vit-Fe Fumarate-FA [facility] PRENATAL  VITAMINS) 28-0.8 MG TABS Take 1 tablet by mouth daily. Patient taking differently: Take 1 tablet by mouth daily. Gummies 08/03/16   Zadie Rhine, MD    Allergies    Ultram [tramadol] and Augmentin [amoxicillin-pot clavulanate]  Review of Systems   Review of Systems  All other systems reviewed and are negative.   Physical Exam Updated Vital Signs BP 110/68 (BP Location: Right Arm)   Pulse 72   Temp 98.2 F (36.8 C) (Oral)   Resp 18   Ht 1.6 m (5\' 3" )   Wt 77.1 kg   LMP (Approximate)   SpO2 97%   BMI 30.11 kg/m   Physical Exam Vitals and nursing note reviewed.  Constitutional:      General: She is not in acute distress.    Appearance: She is well-developed and well-nourished.  HENT:     Head: Normocephalic and atraumatic.     Mouth/Throat:     Mouth: Oropharynx is clear and moist.      Pharynx: No oropharyngeal exudate.  Eyes:     General: No scleral icterus.       Right eye: No discharge.        Left eye: No discharge.     Extraocular Movements: EOM normal.     Conjunctiva/sclera: Conjunctivae normal.     Pupils: Pupils are equal, round, and reactive to light.  Neck:     Thyroid: No thyromegaly.     Vascular: No JVD.  Cardiovascular:     Rate and Rhythm: Normal rate and regular rhythm.     Pulses: Intact distal pulses.     Heart sounds: Normal heart sounds. No murmur heard. No friction rub. No gallop.   Pulmonary:     Effort: Pulmonary effort is normal. No respiratory distress.     Breath sounds: Normal breath sounds. No wheezing or rales.  Abdominal:     General: Bowel sounds are normal. There is no distension.     Palpations: Abdomen is soft. There is no mass.     Tenderness: There is abdominal tenderness in the right upper quadrant, right lower quadrant and suprapubic area. There is right CVA tenderness. There is no left CVA tenderness, guarding or rebound. Negative signs include Murphy's sign, Rovsing's sign, McBurney's sign, psoas sign and obturator sign.     Hernia: No hernia is present.  Genitourinary:    Comments:  Female chaperone present.  Swabs obtained - no odor, creamy d/c, excessive, sent for culture Musculoskeletal:        General: No tenderness or edema. Normal range of motion.     Cervical back: Normal range of motion and neck supple.  Lymphadenopathy:     Cervical: No cervical adenopathy.  Skin:    General: Skin is warm and dry.     Findings: No erythema or rash.  Neurological:     Mental Status: She is alert.     Coordination: Coordination normal.  Psychiatric:        Mood and Affect: Mood and affect normal.        Behavior: Behavior normal.     ED Results / Procedures / Treatments   Labs (all labs ordered are listed, but only abnormal results are displayed) Labs Reviewed  COMPREHENSIVE METABOLIC PANEL - Abnormal; Notable for the  following components:      Result Value   AST 14 (*)    All other components within normal limits  CBC - Abnormal; Notable for the following components:   HCT  35.4 (*)    All other components within normal limits  URINALYSIS, ROUTINE W REFLEX MICROSCOPIC - Abnormal; Notable for the following components:   APPearance CLOUDY (*)    All other components within normal limits  WET PREP, GENITAL  LIPASE, BLOOD  POC URINE PREG, ED  GC/CHLAMYDIA PROBE AMP ([place]) NOT AT [facility]    EKG None  Radiology CT ABDOMEN PELVIS W CONTRAST  Result Date: 01/22/2021 CLINICAL DATA:  Right lower quadrant pain EXAM: CT ABDOMEN AND PELVIS WITH CONTRAST TECHNIQUE: Multidetector CT imaging of the abdomen and pelvis was performed using the standard protocol following bolus administration of intravenous contrast. CONTRAST:  OMNIPAQUE IOHEXOL 300 MG/ML  SOLN COMPARISON:  08/27/2017 FINDINGS: Lower chest: No acute abnormality. Hepatobiliary: No focal hepatic abnormality. Gallbladder unremarkable. Pancreas: No focal abnormality or ductal dilatation. Spleen: No focal abnormality.  Normal size. Adrenals/Urinary Tract: No adrenal abnormality. No focal renal abnormality. No stones or hydronephrosis. Urinary bladder is unremarkable. Stomach/Bowel: The appendix is borderline dilated measuring up to 8 mm in diameter. Surrounding inflammation. Findings concerning for early acute appendicitis. Stomach, large and small bowel grossly unremarkable. Vascular/Lymphatic: No evidence of aneurysm or adenopathy. Reproductive: Uterus and adnexa unremarkable.  No mass. Other: Small amount of free fluid in the pelvis.  No free air. Musculoskeletal: No acute bony abnormality. IMPRESSION: Borderline size of the appendix. Slight haziness/stranding around the appendix concerning for early acute appendicitis. Electronically Signed   By: Charlett Nose M.D.   On: 01/22/2021 20:40    Procedures Procedures   Medications Ordered in  ED Medications  ciprofloxacin (CIPRO) IVPB 400 mg (has no administration in time range)    And  metroNIDAZOLE (FLAGYL) IVPB 500 mg (has no administration in time range)  iohexol (OMNIPAQUE) 300 MG/ML solution 100 mL (100 mLs Intravenous Contrast Given 01/22/21 2024)    ED Course  I have reviewed the triage vital signs and the nursing notes.  Pertinent labs & imaging results that were available during my care of the patient were reviewed by me and considered in my medical decision making (see chart for details).    MDM Rules/Calculators/A&P                           This patient has unremarkable laboratory work-up however the CT scan does show a borderline upper size of the appendix with associated stranding around it which is suggestive of early acute appendicitis.  Discussed with Dr. Lovell Sheehan of the surgical service agreeable to antibiotics, he will see the patient in the morning, n.p.o. after midnight, admit to hospitalist.  Cipro and Flagyl ordered due to amoxicillin allergy  D/w Dr. Delora Fuel who will admit  Final Clinical Impression(s) / ED Diagnoses Final diagnoses:  Acute appendicitis, unspecified acute appendicitis type      Eber Hong, MD 01/23/21 1004

## 2021-01-22 NOTE — H&P (Signed)
History and Physical  Donna Reeves FAO:[id number] DOB: 1996/03/16 DOA: 01/22/2021  Referring physician: Eber Hong, MD PCP: Donna Deiters, MD  Patient coming from: Home  Chief Complaint: Abdominal Pain   HPI: Donna Reeves is a 25 y.o. female with medical history significant for herniated disc who presents to the emergency department due to 4-day onset of right lower quadrant abdominal pain which acutely worsened last night (1/25). Pain started in the suprapubic region and radiated to right lower quadrant and right flank. Pain was described as sharp, constant and rated as 10/10 on pain scale, this was associated with nausea and vomiting x2 (nonbilious, nonbloody) with last vomitus being yesterday. Resting without moving alleviates the pain, any sort of movement, coughing, laughing, deep breath aggravates the pain. She went to see her PCP this morning and she was asked to go to the ED for further evaluation to rule out appendicitis. She denies fever, chills, chest pain, shortness of breath burning sensation on urination or any other irritative bladder symptoms.  ED Course:  In the emergency department, she was hemodynamically stable. Work-up in the ED showed normal CBC and BMP, lipase 17, pregnancy test was negative, urinalysis was unimpressive for UTI. CT abdomen and pelvis with contrast showed borderline size of the appendix. Slight haziness/stranding around the appendix concerning for early acute appendicitis  Review of Systems:  Constitutional: Negative for chills and fever.  HENT: Negative for ear pain and sore throat.   Eyes: Negative for pain and visual disturbance.  Respiratory: Negative for cough, chest tightness and shortness of breath.   Cardiovascular: Negative for chest pain and palpitations.  Gastrointestinal: Positive for abdominal pain, nausea and vomiting.  Endocrine: Negative for polyphagia and polyuria.  Genitourinary: Negative for decreased urine volume,  dysuria, enuresis Musculoskeletal: Negative for arthralgias and back pain.  Skin: Negative for color change and rash.  Allergic/Immunologic: Negative for immunocompromised state.  Neurological: Negative for tremors, syncope, speech difficulty, weakness, light-headedness and headaches.  Hematological: Does not bruise/bleed easily.  All other systems reviewed and are negative    Past Medical History:  Diagnosis Date  . Degenerative disc disease   . Herniated disc   . Sciatica    History reviewed. No pertinent surgical history.  Social History:  reports that she has never smoked. She has never used smokeless tobacco. She reports that she does not drink alcohol and does not use drugs.   Allergies  Allergen Reactions  . Ultram [Tramadol] Nausea Only and Other (See Comments)    headache  . Augmentin [Amoxicillin-Pot Clavulanate] Other (See Comments)    Unknown.    History reviewed. No pertinent family history.   Prior to Admission medications   Medication Sig Start Date End Date Taking? Authorizing Provider  ciprofloxacin (CIPRO) 500 MG tablet Take 1 tablet (500 mg total) by mouth 2 (two) times daily. 10/05/18   Devoria Albe, MD  ibuprofen (ADVIL,MOTRIN) 400 MG tablet Take 1 tablet (400 mg total) by mouth every 6 (six) hours as needed. 08/27/17   Ivery Quale, PA-C  methocarbamol (ROBAXIN) 500 MG tablet Take 1 tablet (500 mg total) by mouth 2 (two) times daily. 12/14/19   Khatri, Hina, PA-C  naproxen (NAPROSYN) 500 MG tablet Take 1 tablet (500 mg total) by mouth 2 (two) times daily. 12/14/19   Khatri, Hina, PA-C  ondansetron (ZOFRAN ODT) 4 MG disintegrating tablet 4mg  ODT q4 hours prn nausea/vomit Patient taking differently: Take 4 mg by mouth every 8 (eight) hours as needed for nausea or vomiting.  08/25/16   Blane Ohara, MD  ondansetron (ZOFRAN) 4 MG tablet Take 1 tablet (4 mg total) by mouth every 8 (eight) hours as needed for nausea or vomiting. 10/05/18   Devoria Albe, MD   Prenatal Vit-Fe Fumarate-FA [facility] PRENATAL VITAMINS) 28-0.8 MG TABS Take 1 tablet by mouth daily. Patient taking differently: Take 1 tablet by mouth daily. Gummies 08/03/16   Zadie Rhine, MD    Physical Exam: BP 115/76   Pulse 82   Temp 98.2 F (36.8 C) (Oral)   Resp 16   Ht 5\' 3"  (1.6 m)   Wt 77.1 kg   LMP 01/13/2021 (Approximate)   SpO2 100%   BMI 30.11 kg/m   . General: 25 y.o. year-old female well developed well nourished in no acute distress.  Alert and oriented x3. 30 HEENT: NCAT, EOMI . Neck: Supple, trachea media . Cardiovascular: Regular rate and rhythm with no rubs or gallops.  No thyromegaly or JVD noted.  No lower extremity edema. 2/4 pulses in all 4 extremities. Marland Kitchen Respiratory: Clear to auscultation with no wheezes or rales. Good inspiratory effort. . Abdomen: Soft, tender to palpation of suprapubic and RLQ, Normal bowel sounds x4 quadrants. . Muskuloskeletal: No cyanosis, clubbing or edema noted bilaterally . Neuro: CN II-XII intact, strength, sensation, reflexes . Skin: No ulcerative lesions noted or rashes . Psychiatry: Judgement and insight appear normal. Mood is appropriate for condition and setting          Labs on Admission:  Basic Metabolic Panel: Recent Labs  Lab 01/22/21 1405  NA 136  K 3.7  CL 104  CO2 26  GLUCOSE 90  BUN 9  CREATININE 0.54  CALCIUM 9.2   Liver Function Tests: Recent Labs  Lab 01/22/21 1405  AST 14*  ALT 13  ALKPHOS 80  BILITOT 0.9  PROT 7.0  ALBUMIN 4.0   Recent Labs  Lab 01/22/21 1405  LIPASE 17   No results for input(s): AMMONIA in the last 168 hours. CBC: Recent Labs  Lab 01/22/21 1405  WBC 7.2  HGB 12.5  HCT 35.4*  MCV 84.9  PLT 279   Cardiac Enzymes: No results for input(s): CKTOTAL, CKMB, CKMBINDEX, TROPONINI in the last 168 hours.  BNP (last 3 results) No results for input(s): BNP in the last 8760 hours.  ProBNP (last 3 results) No results for input(s): PROBNP in the last 8760  hours.  CBG: No results for input(s): GLUCAP in the last 168 hours.  Radiological Exams on Admission: CT ABDOMEN PELVIS W CONTRAST  Result Date: 01/22/2021 CLINICAL DATA:  Right lower quadrant pain EXAM: CT ABDOMEN AND PELVIS WITH CONTRAST TECHNIQUE: Multidetector CT imaging of the abdomen and pelvis was performed using the standard protocol following bolus administration of intravenous contrast. CONTRAST:  01/24/2021 OMNIPAQUE IOHEXOL 300 MG/ML  SOLN COMPARISON:  08/27/2017 FINDINGS: Lower chest: No acute abnormality. Hepatobiliary: No focal hepatic abnormality. Gallbladder unremarkable. Pancreas: No focal abnormality or ductal dilatation. Spleen: No focal abnormality.  Normal size. Adrenals/Urinary Tract: No adrenal abnormality. No focal renal abnormality. No stones or hydronephrosis. Urinary bladder is unremarkable. Stomach/Bowel: The appendix is borderline dilated measuring up to 8 mm in diameter. Surrounding inflammation. Findings concerning for early acute appendicitis. Stomach, large and small bowel grossly unremarkable. Vascular/Lymphatic: No evidence of aneurysm or adenopathy. Reproductive: Uterus and adnexa unremarkable.  No mass. Other: Small amount of free fluid in the pelvis.  No free air. Musculoskeletal: No acute bony abnormality. IMPRESSION: Borderline size of the appendix. Slight haziness/stranding around the appendix  concerning for early acute appendicitis. Electronically Signed   By: Charlett Nose M.D.   On: 01/22/2021 20:40    EKG: I independently viewed the EKG done and my findings are as followed: EKG was not done in the ED  Assessment/Plan Present on Admission: . Appendicitis  Principal Problem:   Appendicitis Active Problems:   Abdominal pain   Nausea & vomiting   History of herniated intervertebral disc   Nausea, vomiting and abdominal pain possibly secondary to acute appendicitis Patient will be admitted to [facility] Continue IV NS at 43 mLs/Hr She was treated with IV  ciprofloxacin and Flagyl in the ED Continue IV Toradol 15 mg q.6h p.r.n. for moderate to severe pain Continue IV Zofran p.r.n. for nausea vomiting Patient will be placed n.p.o. after midnight Surgery (Dr. Lovell Sheehan) was already consulted by ED physician and will follow up with patient in the morning.  History of Herniated disc Stable   DVT prophylaxis: SCDs  Code Status: Full code  Family Communication: Mom at bedside (all questions answered to satisfaction)  Disposition Plan:  Patient is from:                        home Anticipated DC to:                   home Anticipated DC date:               2-3 days Anticipated DC barriers:          Patient is unstable to be discharged home at this time due to abdominal pain secondary to acute appendicitis pending surgical evaluation and treatment  Consults called: Dr. Lovell Sheehan (by ED physician)  Admission status: Inpatient  Frankey Shown MD [facility]  01/23/2021, 3:15 AM

## 2021-01-23 ENCOUNTER — Encounter (HOSPITAL_COMMUNITY): Payer: Self-pay | Admitting: Internal Medicine

## 2021-01-23 ENCOUNTER — Ambulatory Visit (HOSPITAL_COMMUNITY): Payer: Medicaid Other | Admitting: Certified Registered"

## 2021-01-23 ENCOUNTER — Encounter (HOSPITAL_COMMUNITY)
Admission: EM | Disposition: A | Discharge status: Home / Self Care | Payer: Self-pay | Source: Home / Self Care | Attending: Internal Medicine

## 2021-01-23 DIAGNOSIS — R1031 Right lower quadrant pain: Secondary | ICD-10-CM | POA: Diagnosis not present

## 2021-01-23 DIAGNOSIS — Z8739 Personal history of other diseases of the musculoskeletal system and connective tissue: Secondary | ICD-10-CM

## 2021-01-23 DIAGNOSIS — K3580 Unspecified acute appendicitis: Secondary | ICD-10-CM | POA: Diagnosis not present

## 2021-01-23 DIAGNOSIS — Z881 Allergy status to other antibiotic agents status: Secondary | ICD-10-CM | POA: Diagnosis not present

## 2021-01-23 DIAGNOSIS — Z20822 Contact with and (suspected) exposure to covid-19: Secondary | ICD-10-CM | POA: Diagnosis not present

## 2021-01-23 HISTORY — PX: LAPAROSCOPIC APPENDECTOMY: SHX408

## 2021-01-23 LAB — CBC
HCT: 33.5 % — ABNORMAL LOW (ref 36.0–46.0)
Hemoglobin: 11.6 g/dL — ABNORMAL LOW (ref 12.0–15.0)
MCH: 29.3 pg (ref 26.0–34.0)
MCHC: 34.6 g/dL (ref 30.0–36.0)
MCV: 84.6 fL (ref 80.0–100.0)
Platelets: 259 10*3/uL (ref 150–400)
RBC: 3.96 MIL/uL (ref 3.87–5.11)
RDW: 12.5 % (ref 11.5–15.5)
WBC: 4.7 10*3/uL (ref 4.0–10.5)
nRBC: 0 % (ref 0.0–0.2)

## 2021-01-23 LAB — GC/CHLAMYDIA PROBE AMP (~~LOC~~) NOT AT ARMC
Chlamydia: NEGATIVE
Comment: NEGATIVE
Comment: NORMAL
Neisseria Gonorrhea: NEGATIVE

## 2021-01-23 LAB — COMPREHENSIVE METABOLIC PANEL
ALT: 12 U/L (ref 0–44)
AST: 12 U/L — ABNORMAL LOW (ref 15–41)
Albumin: 3.4 g/dL — ABNORMAL LOW (ref 3.5–5.0)
Alkaline Phosphatase: 68 U/L (ref 38–126)
Anion gap: 8 (ref 5–15)
BUN: 10 mg/dL (ref 6–20)
CO2: 24 mmol/L (ref 22–32)
Calcium: 8.6 mg/dL — ABNORMAL LOW (ref 8.9–10.3)
Chloride: 105 mmol/L (ref 98–111)
Creatinine, Ser: 0.58 mg/dL (ref 0.44–1.00)
GFR, Estimated: >60 mL/min (ref >60)
Glucose, Bld: 100 mg/dL — ABNORMAL HIGH (ref 70–99)
Potassium: 3.4 mmol/L — ABNORMAL LOW (ref 3.5–5.1)
Sodium: 137 mmol/L (ref 135–145)
Total Bilirubin: 0.5 mg/dL (ref 0.3–1.2)
Total Protein: 6.1 g/dL — ABNORMAL LOW (ref 6.5–8.1)

## 2021-01-23 LAB — APTT: aPTT: 34 seconds (ref 24–36)

## 2021-01-23 LAB — SARS CORONAVIRUS 2 BY RT PCR (HOSPITAL ORDER, PERFORMED IN ~~LOC~~ HOSPITAL LAB): SARS Coronavirus 2: NEGATIVE

## 2021-01-23 LAB — PROTIME-INR
INR: 1.1 (ref 0.8–1.2)
Prothrombin Time: 13.7 seconds (ref 11.4–15.2)

## 2021-01-23 LAB — MAGNESIUM: Magnesium: 1.6 mg/dL — ABNORMAL LOW (ref 1.7–2.4)

## 2021-01-23 LAB — PHOSPHORUS: Phosphorus: 4.3 mg/dL (ref 2.5–4.6)

## 2021-01-23 SURGERY — APPENDECTOMY, LAPAROSCOPIC
Anesthesia: General | Site: Abdomen

## 2021-01-23 MED ORDER — DEXAMETHASONE SODIUM PHOSPHATE 10 MG/ML IJ SOLN
INTRAMUSCULAR | Status: AC
  Filled 2021-01-23: qty 1

## 2021-01-23 MED ORDER — MAGNESIUM SULFATE 2 GM/50ML IV SOLN
2.0000 g | Freq: Once | INTRAVENOUS | Status: AC
  Administered 2021-01-23: 2 g via INTRAVENOUS
  Filled 2021-01-23: qty 50

## 2021-01-23 MED ORDER — DEXAMETHASONE SODIUM PHOSPHATE 10 MG/ML IJ SOLN
INTRAMUSCULAR | Status: DC | PRN
  Administered 2021-01-23: 8 mg via INTRAVENOUS

## 2021-01-23 MED ORDER — ONDANSETRON HCL 4 MG/2ML IJ SOLN
INTRAMUSCULAR | Status: AC
  Filled 2021-01-23: qty 2

## 2021-01-23 MED ORDER — ROCURONIUM BROMIDE 10 MG/ML (PF) SYRINGE
PREFILLED_SYRINGE | INTRAVENOUS | Status: DC | PRN
  Administered 2021-01-23: 30 mg via INTRAVENOUS

## 2021-01-23 MED ORDER — LACTATED RINGERS IV SOLN
INTRAVENOUS | Status: DC
  Administered 2021-01-23: 1000 mL via INTRAVENOUS

## 2021-01-23 MED ORDER — HYDROCODONE-ACETAMINOPHEN 5-325 MG PO TABS: 1.0000 | ORAL_TABLET | ORAL | 0 refills | Status: DC | PRN

## 2021-01-23 MED ORDER — LIDOCAINE 2% (20 MG/ML) 5 ML SYRINGE
INTRAMUSCULAR | Status: DC | PRN
  Administered 2021-01-23: 100 mg via INTRAVENOUS

## 2021-01-23 MED ORDER — ONDANSETRON HCL 4 MG/2ML IJ SOLN: 4.0000 mg | Freq: Once | INTRAMUSCULAR | Status: DC | PRN

## 2021-01-23 MED ORDER — CHLORHEXIDINE GLUCONATE 0.12 % MT SOLN
15.0000 mL | Freq: Once | OROMUCOSAL | Status: AC
  Administered 2021-01-23: 15 mL via OROMUCOSAL

## 2021-01-23 MED ORDER — SUGAMMADEX SODIUM 200 MG/2ML IV SOLN
INTRAVENOUS | Status: DC | PRN
  Administered 2021-01-23: 200 mg via INTRAVENOUS

## 2021-01-23 MED ORDER — DEXMEDETOMIDINE (PRECEDEX) IN NS 20 MCG/5ML (4 MCG/ML) IV SYRINGE
PREFILLED_SYRINGE | INTRAVENOUS | Status: DC | PRN
  Administered 2021-01-23 (×2): 10 ug via INTRAVENOUS

## 2021-01-23 MED ORDER — BUPIVACAINE LIPOSOME 1.3 % IJ SUSP
INTRAMUSCULAR | Status: AC
  Filled 2021-01-23: qty 20

## 2021-01-23 MED ORDER — SUCCINYLCHOLINE CHLORIDE 200 MG/10ML IV SOSY
PREFILLED_SYRINGE | INTRAVENOUS | Status: DC | PRN
  Administered 2021-01-23: 120 mg via INTRAVENOUS

## 2021-01-23 MED ORDER — SODIUM CHLORIDE 0.9 % IR SOLN
Status: DC | PRN
  Administered 2021-01-23: 1000 mL

## 2021-01-23 MED ORDER — FENTANYL CITRATE (PF) 250 MCG/5ML IJ SOLN
INTRAMUSCULAR | Status: AC
  Filled 2021-01-23: qty 5

## 2021-01-23 MED ORDER — CHLORHEXIDINE GLUCONATE CLOTH 2 % EX PADS: 6.0000 | MEDICATED_PAD | Freq: Once | CUTANEOUS | Status: DC

## 2021-01-23 MED ORDER — KETOROLAC TROMETHAMINE 30 MG/ML IJ SOLN
INTRAMUSCULAR | Status: AC
  Filled 2021-01-23: qty 1

## 2021-01-23 MED ORDER — POTASSIUM CHLORIDE 10 MEQ/100ML IV SOLN
10.0000 meq | INTRAVENOUS | Status: AC
  Administered 2021-01-23: 10 meq via INTRAVENOUS
  Filled 2021-01-23: qty 100

## 2021-01-23 MED ORDER — ROCURONIUM BROMIDE 10 MG/ML (PF) SYRINGE
PREFILLED_SYRINGE | INTRAVENOUS | Status: AC
  Filled 2021-01-23: qty 10

## 2021-01-23 MED ORDER — PROPOFOL 10 MG/ML IV BOLUS
INTRAVENOUS | Status: AC
  Filled 2021-01-23: qty 20

## 2021-01-23 MED ORDER — MIDAZOLAM HCL 2 MG/2ML IJ SOLN
INTRAMUSCULAR | Status: AC
  Filled 2021-01-23: qty 2

## 2021-01-23 MED ORDER — ORAL CARE MOUTH RINSE: 15.0000 mL | Freq: Once | OROMUCOSAL | Status: AC

## 2021-01-23 MED ORDER — MIDAZOLAM HCL 5 MG/5ML IJ SOLN
INTRAMUSCULAR | Status: DC | PRN
  Administered 2021-01-23: 2 mg via INTRAVENOUS

## 2021-01-23 MED ORDER — KETOROLAC TROMETHAMINE 30 MG/ML IJ SOLN
INTRAMUSCULAR | Status: DC | PRN
  Administered 2021-01-23: 30 mg via INTRAVENOUS

## 2021-01-23 MED ORDER — ONDANSETRON HCL 4 MG/2ML IJ SOLN
INTRAMUSCULAR | Status: DC | PRN
  Administered 2021-01-23: 4 mg via INTRAVENOUS

## 2021-01-23 MED ORDER — LIDOCAINE HCL (PF) 2 % IJ SOLN
INTRAMUSCULAR | Status: AC
  Filled 2021-01-23: qty 5

## 2021-01-23 MED ORDER — BUPIVACAINE LIPOSOME 1.3 % IJ SUSP
INTRAMUSCULAR | Status: DC | PRN
  Administered 2021-01-23: 20 mL

## 2021-01-23 MED ORDER — SUCCINYLCHOLINE CHLORIDE 200 MG/10ML IV SOSY
PREFILLED_SYRINGE | INTRAVENOUS | Status: AC
  Filled 2021-01-23: qty 10

## 2021-01-23 MED ORDER — PROPOFOL 10 MG/ML IV BOLUS
INTRAVENOUS | Status: DC | PRN
  Administered 2021-01-23: 200 mg via INTRAVENOUS

## 2021-01-23 MED ORDER — FENTANYL CITRATE (PF) 100 MCG/2ML IJ SOLN
INTRAMUSCULAR | Status: DC | PRN
  Administered 2021-01-23: 50 ug via INTRAVENOUS
  Administered 2021-01-23: 100 ug via INTRAVENOUS

## 2021-01-23 MED ORDER — DEXMEDETOMIDINE (PRECEDEX) IN NS 20 MCG/5ML (4 MCG/ML) IV SYRINGE
PREFILLED_SYRINGE | INTRAVENOUS | Status: AC
  Filled 2021-01-23: qty 10

## 2021-01-23 MED ORDER — FENTANYL CITRATE (PF) 100 MCG/2ML IJ SOLN
25.0000 ug | INTRAMUSCULAR | Status: DC | PRN
  Administered 2021-01-23: 50 ug via INTRAVENOUS
  Filled 2021-01-23: qty 2

## 2021-01-23 SURGICAL SUPPLY — 46 items
ADH SKN CLS APL DERMABOND .7 (GAUZE/BANDAGES/DRESSINGS) ×1
APL PRP STRL LF DISP 70% ISPRP (MISCELLANEOUS) ×1
BAG RETRIEVAL 10 (BASKET) ×1
CHLORAPREP W/TINT 26 (MISCELLANEOUS) ×2 IMPLANT
CLOTH BEACON ORANGE TIMEOUT ST (SAFETY) ×2 IMPLANT
COVER LIGHT HANDLE STERIS (MISCELLANEOUS) ×4 IMPLANT
COVER WAND RF STERILE (DRAPES) ×2 IMPLANT
CUTTER FLEX LINEAR 45M (STAPLE) ×2 IMPLANT
DERMABOND ADVANCED (GAUZE/BANDAGES/DRESSINGS) ×1
DERMABOND ADVANCED .7 DNX12 (GAUZE/BANDAGES/DRESSINGS) ×1 IMPLANT
ELECT REM PT RETURN 9FT ADLT (ELECTROSURGICAL) ×2
ELECTRODE REM PT RTRN 9FT ADLT (ELECTROSURGICAL) ×1 IMPLANT
GLOVE BIOGEL PI IND STRL 7.0 (GLOVE) ×3 IMPLANT
GLOVE BIOGEL PI INDICATOR 7.0 (GLOVE) ×3
GLOVE SURG SS PI 7.5 STRL IVOR (GLOVE) ×2 IMPLANT
GOWN STRL REUS W/TWL LRG LVL3 (GOWN DISPOSABLE) ×4 IMPLANT
INST SET LAPROSCOPIC AP (KITS) ×2 IMPLANT
KIT TURNOVER KIT A (KITS) ×2 IMPLANT
LIGASURE LAP ATLAS 10MM 37CM (INSTRUMENTS) ×2 IMPLANT
MANIFOLD NEPTUNE II (INSTRUMENTS) ×2 IMPLANT
NEEDLE HYPO 18GX1.5 BLUNT FILL (NEEDLE) ×2 IMPLANT
NEEDLE HYPO 21X1.5 SAFETY (NEEDLE) ×2 IMPLANT
NEEDLE INSUFFLATION 14GA 120MM (NEEDLE) ×2 IMPLANT
NS IRRIG 1000ML POUR BTL (IV SOLUTION) ×2 IMPLANT
PACK LAP CHOLE LZT030E (CUSTOM PROCEDURE TRAY) ×2 IMPLANT
PAD ARMBOARD 7.5X6 YLW CONV (MISCELLANEOUS) ×2 IMPLANT
PENCIL HANDSWITCHING (ELECTRODE) ×2 IMPLANT
PENCIL SMOKE EVACUATOR COATED (MISCELLANEOUS) ×2 IMPLANT
RELOAD 45 VASCULAR/THIN (ENDOMECHANICALS) IMPLANT
RELOAD STAPLE TA45 3.5 REG BLU (ENDOMECHANICALS) IMPLANT
SET BASIN LINEN APH (SET/KITS/TRAYS/PACK) ×2 IMPLANT
SET TUBE IRRIG SUCTION NO TIP (IRRIGATION / IRRIGATOR) ×2 IMPLANT
SET TUBE SMOKE EVAC HIGH FLOW (TUBING) ×2 IMPLANT
SHEARS HARMONIC ACE PLUS 36CM (ENDOMECHANICALS) ×2 IMPLANT
SUT MNCRL AB 4-0 PS2 18 (SUTURE) ×4 IMPLANT
SUT VICRYL 0 UR6 27IN ABS (SUTURE) ×2 IMPLANT
SYR 20ML LL LF (SYRINGE) ×4 IMPLANT
SYS BAG RETRIEVAL 10MM (BASKET) ×1
SYSTEM BAG RETRIEVAL 10MM (BASKET) ×1 IMPLANT
TRAY FOLEY W/BAG SLVR 16FR (SET/KITS/TRAYS/PACK) ×2
TRAY FOLEY W/BAG SLVR 16FR ST (SET/KITS/TRAYS/PACK) ×1 IMPLANT
TROCAR ENDO BLADELESS 11MM (ENDOMECHANICALS) ×2 IMPLANT
TROCAR ENDO BLADELESS 12MM (ENDOMECHANICALS) ×2 IMPLANT
TROCAR XCEL NON-BLD 5MMX100MML (ENDOMECHANICALS) ×2 IMPLANT
TUBE CONNECTING 12X1/4 (SUCTIONS) ×2 IMPLANT
WARMER LAPAROSCOPE (MISCELLANEOUS) ×2 IMPLANT

## 2021-01-23 NOTE — ED Notes (Signed)
Report given to the OR.  Pt to OR at this time.

## 2021-01-23 NOTE — Anesthesia Preprocedure Evaluation (Addendum)
Anesthesia Evaluation  Patient identified by MRN, date of birth, ID band Patient awake    Reviewed: Allergy & Precautions, H&P , NPO status , Patient's Chart, lab work & pertinent test results, reviewed documented beta blocker date and time   Airway Mallampati: II  TM Distance: >3 FB Neck ROM: full    Dental no notable dental hx. (+) Dental Advisory Given   Pulmonary neg pulmonary ROS,    Pulmonary exam normal breath sounds clear to auscultation       Cardiovascular Exercise Tolerance: Good hypertension,  Rhythm:regular Rate:Normal     Neuro/Psych  Neuromuscular disease negative psych ROS   GI/Hepatic negative GI ROS, Neg liver ROS,   Endo/Other  negative endocrine ROS  Renal/GU negative Renal ROS  negative genitourinary   Musculoskeletal   Abdominal   Peds  Hematology negative hematology ROS (+)   Anesthesia Other Findings   Reproductive/Obstetrics negative OB ROS                            Anesthesia Physical Anesthesia Plan  ASA: II and emergent  Anesthesia Plan: General   Post-op Pain Management:    Induction:   PONV Risk Score and Plan: Ondansetron  Airway Management Planned:   Additional Equipment:   Intra-op Plan:   Post-operative Plan:   Informed Consent: I have reviewed the patients History and Physical, chart, labs and discussed the procedure including the risks, benefits and alternatives for the proposed anesthesia with the patient or authorized representative who has indicated his/her understanding and acceptance.     Dental Advisory Given  Plan Discussed with: CRNA  Anesthesia Plan Comments:         Anesthesia Quick Evaluation

## 2021-01-23 NOTE — Discharge Instructions (Signed)
Laparoscopic Appendectomy, Adult, Care After This sheet gives you information about how to care for yourself after your procedure. Your doctor may also give you more specific instructions. If you have problems or questions, contact your doctor. What can I expect after the procedure? After the procedure, it is common to have:  Little energy for normal activities.  Mild pain in the area where the cuts from surgery (incisions) were made.  Trouble pooping (constipation). This can be caused by: ? Pain medicine. ? A lack of activity. Follow these instructions at home: Medicines  Take over-the-counter and prescription medicines only as told by your doctor.  If you were prescribed an antibiotic medicine, take it as told by your doctor. Do not stop taking it even if you start to feel better.  Do not drive or use heavy machinery while taking prescription pain medicine.  Ask your doctor if the medicine you are taking can cause trouble pooping. You may need to take steps to prevent or treat trouble pooping: ? Drink enough fluid to keep your pee (urine) pale yellow. ? Take over-the-counter or prescription medicines. ? Eat foods that are high in fiber. These include beans, whole grains, and fresh fruits and vegetables. ? Limit foods that are high in fat and sugar. These include fried or sweet foods. Incision care  Follow instructions from your doctor about how to take care of your cuts from surgery. Make sure you: ? Wash your hands with soap and water before and after you change your bandage (dressing). If you cannot use soap and water, use hand sanitizer. ? Change your bandage as told by your doctor. ? Leave stitches (sutures), skin glue, or skin tape (adhesive) strips in place. They may need to stay in place for 2 weeks or longer. If tape strips get loose and curl up, you may trim the loose edges. Do not remove tape strips completely unless your doctor says it is okay.  Check your cuts from  surgery every day for signs of infection. Check for: ? Redness, swelling, or pain. ? Fluid or blood. ? Warmth. ? Pus or a bad smell.   Bathing  Keep your cuts from surgery clean and dry. Clean them as told by your doctor. To do this: 1. Gently wash the cuts with soap and water. 2. Rinse the cuts with water to remove all soap. 3. Pat the cuts dry with a clean towel. Do not rub the cuts.  Do not take baths, swim, or use a hot tub for 2 weeks, or until your doctor says it is okay. You may take showers after 48 hours. Activity  Do not drive for 24 hours if you were given a medicine to help you relax (sedative) during your procedure.  Rest after the procedure. Return to your normal activities as told by your doctor. Ask your doctor what activities are safe for you.  For 3 weeks, or for as long as told by your doctor: ? Do not lift anything that is heavier than 10 lb (4.5 kg), or the limit that you are told. ? Do not play contact sports.   General instructions  If you were sent home with a drain, follow instructions from your doctor on how to care for it.  Take deep breaths. This helps to keep your lungs from getting an infection (pneumonia).  Keep all follow-up visits as told by your doctor. This is important. Contact a doctor if:  You have redness, swelling, or pain around a cut from  surgery.  You have fluid or blood coming from a cut.  Your cut feels warm to the touch.  You have pus or a bad smell coming from a cut or a bandage.  The edges of a cut break open after the stitches have been taken out.  You have pain in your shoulders that gets worse.  You feel dizzy or you pass out (faint).  You have shortness of breath.  You keep feeling sick to your stomach (nauseous).  You keep throwing up (vomiting).  You get watery poop (diarrhea) or you cannot control your poop.  You lose your appetite.  You have swelling or pain in your legs.  You get a rash. Get help right  away if:  You have a fever.  You have trouble breathing.  You have sharp pains in your chest. Summary  After the procedure, it is common to have low energy, mild pain, and trouble pooping.  Infection is a common problem after this procedure. Follow your doctor's instructions about caring for yourself after the procedure.  Rest after the procedure. Return to your normal activities as told by your doctor.  Contact your doctor if you see signs of infection around your cuts from surgery, or you get short of breath. Get help right away if you have a fever, chest pain, or trouble breathing. This information is not intended to replace advice given to you by your health care provider. Make sure you discuss any questions you have with your health care provider. Document Revised: 06/16/2018 Document Reviewed: 06/16/2018 Elsevier Patient Education  2021 Elsevier Inc.     General Anesthesia, Adult, Care After This sheet gives you information about how to care for yourself after your procedure. Your health care provider may also give you more specific instructions. If you have problems or questions, contact your health care provider. What can I expect after the procedure? After the procedure, the following side effects are common:  Pain or discomfort at the IV site.  Nausea.  Vomiting.  Sore throat.  Trouble concentrating.  Feeling cold or chills.  Feeling weak or tired.  Sleepiness and fatigue.  Soreness and body aches. These side effects can affect parts of the body that were not involved in surgery. Follow these instructions at home: For the time period you were told by your health care provider:  Rest.  Do not participate in activities where you could fall or become injured.  Do not drive or use machinery.  Do not drink alcohol.  Do not take sleeping pills or medicines that cause drowsiness.  Do not make important decisions or sign legal documents.  Do not take  care of children on your own.   Eating and drinking  Follow any instructions from your health care provider about eating or drinking restrictions.  When you feel hungry, start by eating small amounts of foods that are soft and easy to digest (bland), such as toast. Gradually return to your regular diet.  Drink enough fluid to keep your urine pale yellow.  If you vomit, rehydrate by drinking water, juice, or clear broth. General instructions  If you have sleep apnea, surgery and certain medicines can increase your risk for breathing problems. Follow instructions from your health care provider about wearing your sleep device: ? Anytime you are sleeping, including during daytime naps. ? While taking prescription pain medicines, sleeping medicines, or medicines that make you drowsy.  Have a responsible adult stay with you for the time you are told.  It is important to have someone help care for you until you are awake and alert.  Return to your normal activities as told by your health care provider. Ask your health care provider what activities are safe for you.  Take over-the-counter and prescription medicines only as told by your health care provider.  If you smoke, do not smoke without supervision.  Keep all follow-up visits as told by your health care provider. This is important. Contact a health care provider if:  You have nausea or vomiting that does not get better with medicine.  You cannot eat or drink without vomiting.  You have pain that does not get better with medicine.  You are unable to pass urine.  You develop a skin rash.  You have a fever.  You have redness around your IV site that gets worse. Get help right away if:  You have difficulty breathing.  You have chest pain.  You have blood in your urine or stool, or you vomit blood. Summary  After the procedure, it is common to have a sore throat or nausea. It is also common to feel tired.  Have a  responsible adult stay with you for the time you are told. It is important to have someone help care for you until you are awake and alert.  When you feel hungry, start by eating small amounts of foods that are soft and easy to digest (bland), such as toast. Gradually return to your regular diet.  Drink enough fluid to keep your urine pale yellow.  Return to your normal activities as told by your health care provider. Ask your health care provider what activities are safe for you. This information is not intended to replace advice given to you by your health care provider. Make sure you discuss any questions you have with your health care provider. Document Revised: 08/29/2020 Document Reviewed: 03/28/2020 Elsevier Patient Education  2021 Elsevier Inc.     [place] [facility] [place] [facility] [place] UNTIL Sunday January 26, 2021. DO NOT USE ANY ADDITIONAL NUMBING MEDICATIONS WITHOUT CONSULTING A PHYSICIAN UNTIL AFTER SUNDAY     Bupivacaine Liposomal Suspension for Injection What is this medicine? BUPIVACAINE LIPOSOMAL (bue PIV a kane LIP oh som al) is an anesthetic. It causes loss of feeling in the skin or other tissues. It is used to prevent and to treat pain from some procedures. This medicine may be used for other purposes; ask your health care provider or pharmacist if you have questions. COMMON BRAND NAME(S): EXPAREL What should I tell my health care provider before I take this medicine? They need to know if you have any of these conditions:  G6PD deficiency  heart disease  kidney disease  liver disease  low blood pressure  lung or breathing disease, like asthma  an unusual or allergic reaction to bupivacaine, other medicines, foods, dyes, or preservatives  pregnant or trying to get pregnant  breast-feeding How should I use this medicine? This medicine is injected into the affected area. It is given by a health care provider in a hospital or clinic setting. Talk to your  health care provider about the use of this medicine in children. While it may be given to children as young as 6 years for selected conditions, precautions do apply. Overdosage: If you think you have taken too much of this medicine contact a poison control center or emergency room at once. NOTE: This medicine is only for you. Do not share this medicine with others. What  if I miss a dose? This does not apply. What may interact with this medicine? This medicine may interact with the following medications:  acetaminophen  certain antibiotics like dapsone, nitrofurantoin, aminosalicylic acid, sulfonamides  certain medicines for seizures like phenobarbital, phenytoin, valproic acid  chloroquine  cyclophosphamide  flutamide  hydroxyurea  ifosfamide  metoclopramide  nitric oxide  nitroglycerin  nitroprusside  nitrous oxide  other local anesthetics like lidocaine, pramoxine, tetracaine  primaquine  quinine  rasburicase  sulfasalazine This list may not describe all possible interactions. Give your health care provider a list of all the medicines, herbs, non-prescription drugs, or dietary supplements you use. Also tell them if you smoke, drink alcohol, or use illegal drugs. Some items may interact with your medicine. What should I watch for while using this medicine? Your condition will be monitored carefully while you are receiving this medicine. Be careful to avoid injury while the area is numb, and you are not aware of pain. What side effects may I notice from receiving this medicine? Side effects that you should report to your doctor or health care professional as soon as possible:  allergic reactions like skin rash, itching or hives, swelling of the face, lips, or tongue  seizures  signs and symptoms of a dangerous change in heartbeat or heart rhythm like chest pain; dizziness; fast, irregular heartbeat; palpitations; feeling faint or lightheaded; falls; breathing  problems  signs and symptoms of methemoglobinemia such as pale, gray, or blue colored skin; headache; fast heartbeat; shortness of breath; feeling faint or lightheaded, falls; tiredness Side effects that usually do not require medical attention (report to your doctor or health care professional if they continue or are bothersome):  anxious  back pain  changes in taste  changes in vision  constipation  dizziness  fever  nausea, vomiting This list may not describe all possible side effects. Call your doctor for medical advice about side effects. You may report side effects to FDA at 1-800-FDA-1088. Where should I keep my medicine? This drug is given in a hospital or clinic and will not be stored at home. NOTE: This sheet is a summary. It may not cover all possible information. If you have questions about this medicine, talk to your doctor, pharmacist, or health care provider.  2021 Elsevier/Gold Standard (2020-03-21 12:24:57)     Acetaminophen; Hydrocodone tablets or capsules What is this medicine? ACETAMINOPHEN; HYDROCODONE (a set a MEE noe fen; hye droe KOE done) is a pain reliever. It is used to treat moderate to severe pain. This medicine may be used for other purposes; ask your health care provider or pharmacist if you have questions. COMMON BRAND NAME(S): Anexsia, Bancap HC, Ceta-Plus, Co-Gesic, Comfortpak, Dolagesic, [place], [address], [address], Hydrogesic, [place], Lorcet HD, Lorcet Plus, Lortab, Margesic H, Maxidone, [place], Polygesic, [place], [place], Retail [employer], Vicodin, Vicodin ES, Vicodin HP, [place] What should I tell my health care provider before I take this medicine? They need to know if you have any of these conditions:  brain tumor  drug abuse or addiction  head injury  heart disease  if you often drink alcohol  kidney disease  liver disease  low adrenal gland function  lung disease, asthma, or breathing problems  seizures  stomach  or intestine problems  taken an MAOI like Marplan, Nardil, or Parnate in the last 14 days  an unusual or allergic reaction to acetaminophen, hydrocodone, other medicines, foods, dyes, or preservatives  pregnant or trying to get pregnant  breast-feeding How should I use  this medicine? Take this medicine by mouth with a glass of water. Follow the directions on the prescription label. You can take it with or without food. If it upsets your stomach, take it with food. Do not take your medicine more often than directed. A special MedGuide will be given to you by the pharmacist with each prescription and refill. Be sure to read this information carefully each time. Talk to your pediatrician regarding the use of this medicine in children. Special care may be needed. Overdosage: If you think you have taken too much of this medicine contact a poison control center or emergency room at once. NOTE: This medicine is only for you. Do not share this medicine with others. What if I miss a dose? If you miss a dose, take it as soon as you can. If it is almost time for your next dose, take only that dose. Do not take double or extra doses. What may interact with this medicine? This medicine may interact with the following medications:  alcohol  antiviral medicines for HIV or AIDS  atropine  antihistamines for allergy, cough and cold  certain antibiotics like erythromycin, clarithromycin  certain medicines for anxiety or sleep  certain medicines for bladder problems like oxybutynin, tolterodine  certain medicines for depression like amitriptyline, fluoxetine, sertraline  certain medicines for fungal infections like ketoconazole and itraconazole  certain medicines for Parkinson's disease like benztropine, trihexyphenidyl  certain medicines for seizures like carbamazepine, phenobarbital, phenytoin, primidone  certain medicines for stomach problems like dicyclomine, hyoscyamine  certain  medicines for travel sickness like scopolamine  general anesthetics like halothane, isoflurane, methoxyflurane, propofol  ipratropium  local anesthetics like lidocaine, pramoxine, tetracaine  MAOIs like Carbex, Eldepryl, Marplan, Nardil, and Parnate  medicines that relax muscles for surgery  other medicines with acetaminophen  other narcotic medicines for pain or cough  phenothiazines like chlorpromazine, mesoridazine, prochlorperazine, thioridazine  rifampin This list may not describe all possible interactions. Give your health care provider a list of all the medicines, herbs, non-prescription drugs, or dietary supplements you use. Also tell them if you smoke, drink alcohol, or use illegal drugs. Some items may interact with your medicine. What should I watch for while using this medicine? Tell your health care provider if your pain does not go away, if it gets worse, or if you have new or a different type of pain. You may develop tolerance to this drug. Tolerance means that you will need a higher dose of the drug for pain relief. Tolerance is normal and is expected if you take this drug for a long time. There are different types of narcotic drugs (opioids) for pain. If you take more than one type at the same time, you may have more side effects. Give your health care provider a list of all drugs you use. He or she will tell you how much drug to take. Do not take more drug than directed. Get emergency help right away if you have problems breathing. Do not suddenly stop taking your drug because you may develop a severe reaction. Your body becomes used to the drug. This does NOT mean you are addicted. Addiction is a behavior related to getting and using a drug for a nonmedical reason. If you have pain, you have a medical reason to take pain drug. Your health care provider will tell you how much drug to take. If your health care provider wants you to stop the drug, the dose will be slowly  lowered over time  to avoid any side effects. Talk to your health care provider about naloxone and how to get it. Naloxone is an emergency drug used for an opioid overdose. An overdose can happen if you take too much opioid. It can also happen if an opioid is taken with some other drugs or substances, like alcohol. Know the symptoms of an overdose, like trouble breathing, unusually tired or sleepy, or not being able to respond or wake up. Make sure to tell caregivers and close contacts where it is stored. Make sure they know how to use it. After naloxone is given, you must get emergency help right away. Naloxone is a temporary treatment. Repeat doses may be needed. Do not take other drugs that contain acetaminophen with this drug. Many non-prescription drugs contain acetaminophen. Always read labels carefully. If you have questions, ask your health care provider. If you take too much acetaminophen, get medical help right away. Too much acetaminophen can be very dangerous and cause liver damage. Even if you do not have symptoms, it is important to get help right away. You may get drowsy or dizzy. Do not drive, use machinery, or do anything that needs mental alertness until you know how this drug affects you. Do not stand up or sit up quickly, especially if you are an older patient. This reduces the risk of dizzy or fainting spells. Alcohol may interfere with the effect of this drug. Avoid alcoholic drinks. This drug will cause constipation. If you do not have a bowel movement for 3 days, call your health care provider. Your mouth may get dry. Chewing sugarless gum or sucking hard candy and drinking plenty of water may help. Contact your health care provider if the problem does not go away or is severe. What side effects may I notice from receiving this medicine? Side effects that you should report to your doctor or health care professional as soon as possible:  allergic reactions like skin rash, itching or  hives, swelling of the face, lips, or tongue  breathing problems  confusion  redness, blistering, peeling or loosening of the skin, including inside the mouth  signs and symptoms of low blood pressure like dizziness; feeling faint or lightheaded, falls; unusually weak or tired  trouble passing urine or change in the amount of urine  yellowing of the eyes or skin Side effects that usually do not require medical attention (report to your doctor or health care professional if they continue or are bothersome):  constipation  dry mouth  nausea, vomiting  tiredness This list may not describe all possible side effects. Call your doctor for medical advice about side effects. You may report side effects to FDA at 1-800-FDA-1088. Where should I keep my medicine? Keep out of the reach of children. This medicine can be abused. Keep your medicine in a safe place to protect it from theft. Do not share this medicine with anyone. Selling or giving away this medicine is dangerous and against the law. Store at room temperature between 15 and 30 degrees C (59 and 86 degrees F). This medicine may cause harm and death if it is taken by other adults, children, or pets. Return medicine that has not been used to an official disposal site. Contact the DEA at (530)784-7701 or your city/county government to find a site. If you cannot return the medicine, flush it down the toilet. Do not use the medicine after the expiration date. NOTE: This sheet is a summary. It may not cover all possible information. If  you have questions about this medicine, talk to your doctor, pharmacist, or health care provider.  2021 Elsevier/Gold Standard (2019-07-26 12:25:54)

## 2021-01-23 NOTE — H&P (Signed)
Donna Reeves is an 25 y.o. female.   Chief Complaint: Right lower quadrant abdominal pain HPI: Patient is a 25 year old white female who presented to the emergency room with worsening lower abdominal pain.  Initially started in the suprapubic region but now is more in the right lower quadrant.  She states it started about 4 days ago.  She is also states that she has had intermittent episodes of right lower quadrant abdominal pain in the over the past few months.  It usually resolves on its own.  She has been treated for PID in the past.  She denies any fever or chills.  CT scan of the abdomen reveals early acute appendicitis.  Past Medical History:  Diagnosis Date  . Degenerative disc disease   . Herniated disc   . Sciatica     History reviewed. No pertinent surgical history.  History reviewed. No pertinent family history. Social History:  reports that she has never smoked. She has never used smokeless tobacco. She reports that she does not drink alcohol and does not use drugs.  Allergies:  Allergies  Allergen Reactions  . Ultram [Tramadol] Nausea Only and Other (See Comments)    headache  . Augmentin [Amoxicillin-Pot Clavulanate] Other (See Comments)    Unknown.    (Not in a hospital admission)   Results for orders placed or performed during the hospital encounter of 01/22/21 (from the past 48 hour(s))  Urinalysis, Routine w reflex microscopic     Status: Abnormal   Collection Time: 01/22/21  1:54 PM  Result Value Ref Range   Color, Urine YELLOW YELLOW   APPearance CLOUDY (A) CLEAR   Specific Gravity, Urine 1.023 1.005 - 1.030   pH 5.0 5.0 - 8.0   Glucose, UA NEGATIVE NEGATIVE mg/dL   Hgb urine dipstick NEGATIVE NEGATIVE   Bilirubin Urine NEGATIVE NEGATIVE   Ketones, ur NEGATIVE NEGATIVE mg/dL   Protein, ur NEGATIVE NEGATIVE mg/dL   Nitrite NEGATIVE NEGATIVE   Leukocytes,Ua NEGATIVE NEGATIVE    Comment: Performed at [facility], [address] [place], [place]  [address]  Lipase, blood     Status: None   Collection Time: 01/22/21  2:05 PM  Result Value Ref Range   Lipase 17 11 - 51 U/L    Comment: Performed at [facility], [address], [place], [place] [address]  Comprehensive metabolic panel     Status: Abnormal   Collection Time: 01/22/21  2:05 PM  Result Value Ref Range   Sodium 136 135 - 145 mmol/L   Potassium 3.7 3.5 - 5.1 mmol/L   Chloride 104 98 - 111 mmol/L   CO2 26 22 - 32 mmol/L   Glucose, Bld 90 70 - 99 mg/dL    Comment: Glucose reference range applies only to samples taken after fasting for at least 8 hours.   BUN 9 6 - 20 mg/dL   Creatinine, Ser 1.47 0.44 - 1.00 mg/dL   Calcium 9.2 8.9 - 82.9 mg/dL   Total Protein 7.0 6.5 - 8.1 g/dL   Albumin 4.0 3.5 - 5.0 g/dL   AST 14 (L) 15 - 41 U/L   ALT 13 0 - 44 U/L   Alkaline Phosphatase 80 38 - 126 U/L   Total Bilirubin 0.9 0.3 - 1.2 mg/dL   GFR, Estimated >56 >21 mL/min    Comment: (NOTE) Calculated using the CKD-EPI Creatinine Equation (2021)    Anion gap 6 5 - 15    Comment: Performed at [facility], [address], [place], [place] [address]  CBC     Status: Abnormal   Collection Time: 01/22/21  2:05 PM  Result Value Ref Range   WBC 7.2 4.0 - 10.5 K/uL   RBC 4.17 3.87 - 5.11 MIL/uL   Hemoglobin 12.5 12.0 - 15.0 g/dL   HCT 26.8 (L) 34.1 - 96.2 %   MCV 84.9 80.0 - 100.0 fL   MCH 30.0 26.0 - 34.0 pg   MCHC 35.3 30.0 - 36.0 g/dL   RDW 22.9 79.8 - 92.1 %   Platelets 279 150 - 400 K/uL   nRBC 0.0 0.0 - 0.2 %    Comment: Performed at [facility], [address] [place], [place] [address]  POC urine preg, ED     Status: None   Collection Time: 01/22/21  2:58 PM  Result Value Ref Range   Preg Test, Ur NEGATIVE NEGATIVE    Comment:        THE SENSITIVITY OF THIS METHODOLOGY IS >24 mIU/mL   Wet prep, genital     Status: Abnormal   Collection Time: 01/22/21 10:59 PM   Specimen: Genital  Result Value Ref Range   Yeast Wet Prep HPF POC NONE SEEN NONE SEEN   Trich, Wet Prep  NONE SEEN NONE SEEN   Clue Cells Wet Prep HPF POC PRESENT (A) NONE SEEN   WBC, Wet Prep HPF POC MODERATE (A) NONE SEEN   Sperm NONE SEEN     Comment: Performed at [facility], [address], [place], [place] [address]  Comprehensive metabolic panel     Status: Abnormal   Collection Time: 01/23/21  4:55 AM  Result Value Ref Range   Sodium 137 135 - 145 mmol/L   Potassium 3.4 (L) 3.5 - 5.1 mmol/L   Chloride 105 98 - 111 mmol/L   CO2 24 22 - 32 mmol/L   Glucose, Bld 100 (H) 70 - 99 mg/dL    Comment: Glucose reference range applies only to samples taken after fasting for at least 8 hours.   BUN 10 6 - 20 mg/dL   Creatinine, Ser 4.81 0.44 - 1.00 mg/dL   Calcium 8.6 (L) 8.9 - 10.3 mg/dL   Total Protein 6.1 (L) 6.5 - 8.1 g/dL   Albumin 3.4 (L) 3.5 - 5.0 g/dL   AST 12 (L) 15 - 41 U/L   ALT 12 0 - 44 U/L   Alkaline Phosphatase 68 38 - 126 U/L   Total Bilirubin 0.5 0.3 - 1.2 mg/dL   GFR, Estimated >85 >63 mL/min    Comment: (NOTE) Calculated using the CKD-EPI Creatinine Equation (2021)    Anion gap 8 5 - 15    Comment: Performed at [facility], [address], [place], [place] [address]  CBC     Status: Abnormal   Collection Time: 01/23/21  4:55 AM  Result Value Ref Range   WBC 4.7 4.0 - 10.5 K/uL   RBC 3.96 3.87 - 5.11 MIL/uL   Hemoglobin 11.6 (L) 12.0 - 15.0 g/dL   HCT 26.3 (L) 78.5 - 88.5 %   MCV 84.6 80.0 - 100.0 fL   MCH 29.3 26.0 - 34.0 pg   MCHC 34.6 30.0 - 36.0 g/dL   RDW 02.7 74.1 - 28.7 %   Platelets 259 150 - 400 K/uL   nRBC 0.0 0.0 - 0.2 %    Comment: Performed at [facility], [address], [place], [place] [address]  Protime-INR     Status: None   Collection  Time: 01/23/21  4:55 AM  Result Value Ref Range   Prothrombin Time 13.7 11.4 - 15.2 seconds   INR 1.1 0.8 - 1.2    Comment: (NOTE) INR goal varies based on device and disease states. Performed at [facility], [address], [place], [place] [address]   APTT     Status: None   Collection Time: 01/23/21   4:55 AM  Result Value Ref Range   aPTT 34 24 - 36 seconds    Comment: Performed at [facility], [address] [place], [place] [address]  Magnesium     Status: Abnormal   Collection Time: 01/23/21  4:55 AM  Result Value Ref Range   Magnesium 1.6 (L) 1.7 - 2.4 mg/dL    Comment: Performed at [facility], [address], [place], [place] [address]  Phosphorus     Status: None   Collection Time: 01/23/21  4:55 AM  Result Value Ref Range   Phosphorus 4.3 2.5 - 4.6 mg/dL    Comment: Performed at [facility], [address], [place], [place] [address]   CT ABDOMEN PELVIS W CONTRAST  Result Date: 01/22/2021 CLINICAL DATA:  Right lower quadrant pain EXAM: CT ABDOMEN AND PELVIS WITH CONTRAST TECHNIQUE: Multidetector CT imaging of the abdomen and pelvis was performed using the standard protocol following bolus administration of intravenous contrast. CONTRAST:  OMNIPAQUE IOHEXOL 300 MG/ML  SOLN COMPARISON:  08/27/2017 FINDINGS: Lower chest: No acute abnormality. Hepatobiliary: No focal hepatic abnormality. Gallbladder unremarkable. Pancreas: No focal abnormality or ductal dilatation. Spleen: No focal abnormality.  Normal size. Adrenals/Urinary Tract: No adrenal abnormality. No focal renal abnormality. No stones or hydronephrosis. Urinary bladder is unremarkable. Stomach/Bowel: The appendix is borderline dilated measuring up to 8 mm in diameter. Surrounding inflammation. Findings concerning for early acute appendicitis. Stomach, large and small bowel grossly unremarkable. Vascular/Lymphatic: No evidence of aneurysm or adenopathy. Reproductive: Uterus and adnexa unremarkable.  No mass. Other: Small amount of free fluid in the pelvis.  No free air. Musculoskeletal: No acute bony abnormality. IMPRESSION: Borderline size of the appendix. Slight haziness/stranding around the appendix concerning for early acute appendicitis. Electronically Signed   By: Charlett Nose M.D.   On: 01/22/2021 20:40    Review of  Systems  Constitutional: Positive for fatigue.  HENT: Negative.   Eyes: Negative.   Respiratory: Negative.   Cardiovascular: Negative.   Gastrointestinal: Positive for abdominal pain. Negative for diarrhea and vomiting.  Endocrine: Negative.   Genitourinary: Negative.   Musculoskeletal: Negative.   Allergic/Immunologic: Negative.   Neurological: Negative.   Hematological: Negative.   Psychiatric/Behavioral: Negative.     Blood pressure 111/65, pulse 70, temperature 98.2 F (36.8 C), temperature source Oral, resp. rate 16, height 5\' 3"  (1.6 m), weight 77.1 kg, last menstrual period 01/13/2021, SpO2 100 %, currently breastfeeding. Physical Exam Vitals reviewed.  Constitutional:      Appearance: She is normal weight. She is not ill-appearing.  HENT:     Head: Normocephalic and atraumatic.  Cardiovascular:     Rate and Rhythm: Normal rate and regular rhythm.     Heart sounds: No murmur heard. No friction rub. No gallop.   Pulmonary:     Effort: Pulmonary effort is normal. No respiratory distress.     Breath sounds: Normal breath sounds. No stridor. No wheezing, rhonchi or rales.  Abdominal:     General: Abdomen is flat. There is no distension or abdominal bruit. There are no signs of injury.     Palpations: Abdomen  is soft.     Tenderness: There is abdominal tenderness in the right lower quadrant. There is guarding and rebound. Positive signs include McBurney's sign.     Hernia: No hernia is present.  Skin:    General: Skin is warm and dry.  Neurological:     Mental Status: She is alert and oriented to person, place, and time.   CT scan images personally reviewed  Assessment/Plan Impression: Acute appendicitis Plan: We will take patient to the operating room today for laparoscopic appendectomy.  The risks and benefits of the procedure including bleeding, infection, and the possibility of an open procedure were fully explained to the patient, who gave informed consent.  Franky Macho, MD 01/23/2021, 7:38 AM

## 2021-01-23 NOTE — Interval H&P Note (Signed)
History and Physical Interval Note:  01/23/2021 1:15 PM  Donna Reeves  has presented today for surgery, with the diagnosis of acute appendicitis.  The various methods of treatment have been discussed with the patient and family. After consideration of risks, benefits and other options for treatment, the patient has consented to  Procedure(s): APPENDECTOMY LAPAROSCOPIC (N/A) as a surgical intervention.  The patient's history has been reviewed, patient examined, no change in status, stable for surgery.  I have reviewed the patient's chart and labs.  Questions were answered to the patient's satisfaction.     Franky Macho

## 2021-01-23 NOTE — Anesthesia Procedure Notes (Signed)
Procedure Name: Intubation Date/Time: 01/23/2021 1:25 PM Performed by: Julian Reil, CRNA Pre-anesthesia Checklist: Patient identified, Emergency Drugs available, Suction available and Patient being monitored Patient Re-evaluated:Patient Re-evaluated prior to induction Oxygen Delivery Method: Circle system utilized Preoxygenation: Pre-oxygenation with 100% oxygen Induction Type: Cricoid Pressure applied, Rapid sequence and IV induction Laryngoscope Size: Miller and 3 Grade View: Grade I Tube type: Oral Tube size: 7.0 mm Number of attempts: 1 Airway Equipment and Method: Stylet Placement Confirmation: ETT inserted through vocal cords under direct vision and breath sounds checked- equal and bilateral Secured at: 22 cm Tube secured with: Tape Dental Injury: Teeth and Oropharynx as per pre-operative assessment  Comments: 4x4s bite block used.

## 2021-01-23 NOTE — Op Note (Signed)
Patient:  Donna Reeves  DOB:  February 09, 1996  MRN:  [id number]   Preop Diagnosis: Acute appendicitis  Postop Diagnosis: Same  Procedure: Laparoscopic appendectomy  Surgeon: Franky Macho, MD  Anes: General endotracheal  Indications: Patient is a 25 year old white female who presents with worsening right lower quadrant abdominal pain.  CT scan of the abdomen reveals early acute appendicitis.  The risks and benefits of the procedure including bleeding, infection, and the possibility of an open procedure were fully explained to the patient, who gave informed consent.  Procedure note: The patient was placed in the supine position.  After induction of general endotracheal anesthesia, the abdomen was prepped and draped using the usual sterile technique with ChloraPrep.  Surgical site confirmation was performed.  The supraumbilical incision was made down to the fascia.  A Veress needle was introduced into the abdominal cavity and confirmation of placement was done using the saline drop test.  The abdomen was then insufflated to 15 mmHg pressure.  An 11 mm trocar was introduced into the abdominal cavity under direct visualization without difficulty.  The patient was placed in deeper Trendelenburg position and an additional 12 mm trocar was placed in the suprapubic region and a 5 mm trocar was placed in the left lower quadrant region.  The appendix was visualized and noted to be somewhat tortuous with the distal two thirds of it being injected.  The mesoappendix was divided using the LigaSure.  A vascular load Endo GIA was placed across the base the appendix and fired.  The appendix was then removed using an Endo Catch bag without difficulty.  The staple line was inspected and noted to be within normal limits.  All fluid and air were then evacuated from the abdominal cavity prior to removal of the trochars.  All wounds were irrigated with normal saline.  All wounds were injected with Exparel.  The  supraumbilical fascia was reapproximated using an 0 Vicryl interrupted suture.  All skin incisions were closed using a 4-0 Monocryl subcuticular suture.  Dermabond was applied.  All tape and needle counts were correct at the end of the procedure.  The patient was extubated in the operating room and transferred to PACU in stable condition.  Complications: None  EBL: Minimal  Specimen: Appendix

## 2021-01-23 NOTE — Progress Notes (Signed)
  Otherwise healthy 25 year old female admitted by hospitalist service on 01/22/2021 with acute appendicitis  --Transferred to Gen surgery service on 01/23/2021  --Hospitalist service did Not round on patient on 01/23/2021  Discussed with general surgeon Dr. Lovell Sheehan who was kindly transferred patient to his service  -- Please recall hospitalist service if the need arises  Thank you  Shon Hale, MD

## 2021-01-23 NOTE — Transfer of Care (Signed)
Immediate Anesthesia Transfer of Care Note  Patient: Donna Reeves  Procedure(s) Performed: APPENDECTOMY LAPAROSCOPIC (N/A Abdomen)  Patient Location: PACU  Anesthesia Type:General  Level of Consciousness: drowsy  Airway & Oxygen Therapy: Patient Spontanous Breathing and Patient connected to face mask oxygen  Post-op Assessment: Report given to RN and Post -op Vital signs reviewed and stable  Post vital signs: Reviewed and stable  Last Vitals:  Vitals Value Taken Time  BP    Temp    Pulse 59 01/23/21 1417  Resp 14 01/23/21 1418  SpO2 100 % 01/23/21 1417  Vitals shown include unvalidated device data.  Last Pain:  Vitals:   01/23/21 1114  TempSrc: Oral  PainSc: 2       Patients Stated Pain Goal: 5 (01/23/21 1114)  Complications: No complications documented.

## 2021-01-24 ENCOUNTER — Encounter (HOSPITAL_COMMUNITY): Payer: Self-pay | Admitting: General Surgery

## 2021-01-24 LAB — HIV ANTIBODY (ROUTINE TESTING W REFLEX): HIV Screen 4th Generation wRfx: NONREACTIVE

## 2021-01-24 NOTE — Progress Notes (Signed)
Patient called in with a question awoke this morning after sleeping on right side and states she is swollen and sore wants to take ibuprofen. Stated she could take that and alternate it with her pain med or plain tylenol. Also encouraged patient to get up and walk around frequently throughout the day and to hydrate well. Explained ambulation helps in the recovery process to disperse the air used to inflate abdomen for the procedure. Verbalized understanding

## 2021-01-24 NOTE — Anesthesia Postprocedure Evaluation (Signed)
Anesthesia Post Note  Patient: Donna Reeves  Procedure(s) Performed: APPENDECTOMY LAPAROSCOPIC (N/A Abdomen)  Patient location during evaluation: PACU Anesthesia Type: General Level of consciousness: awake Pain management: pain level controlled Vital Signs Assessment: post-procedure vital signs reviewed and stable Respiratory status: spontaneous breathing Cardiovascular status: blood pressure returned to baseline Anesthetic complications: no   No complications documented.   Last Vitals:  Vitals:   01/23/21 1500 01/23/21 1518  BP: 104/70 111/66  Pulse: 68 62  Resp: 20 20  Temp:  36.6 C  SpO2: 99% 99%    Last Pain:  Vitals:   01/23/21 1518  TempSrc: Oral  PainSc: 2                  Windell Norfolk

## 2021-01-27 LAB — SURGICAL PATHOLOGY

## 2021-01-31 ENCOUNTER — Telehealth (INDEPENDENT_AMBULATORY_CARE_PROVIDER_SITE_OTHER): Payer: Medicaid Other | Admitting: General Surgery

## 2021-01-31 ENCOUNTER — Encounter: Payer: Self-pay | Admitting: General Surgery

## 2021-01-31 DIAGNOSIS — Z09 Encounter for follow-up examination after completed treatment for conditions other than malignant neoplasm: Secondary | ICD-10-CM

## 2021-01-31 NOTE — Progress Notes (Signed)
Subjective:     Donna Reeves  Virtual telephone visit performed with patient.  Patient was on her cell phone and I was in the office.  She states that her incisional pain has gone, but she still has some constipation.  She is not taking any pain pills at the present time.  She has taken a laxative and then she had some diarrhea.  She does have a follow-up appointment with her gynecologist concerning her chronic pelvic pain.  She denies any fever or chills. Objective:    LMP 01/13/2021 (Approximate)   General:  alert, cooperative and no distress  Final pathology consistent with diagnosis.     Assessment:    Doing well postoperatively. Mild constipation.   Plan:   I told her to drink plenty of fluids and she may want to try some MiraLAX.  She should keep her appointment with Dr. Ralph Dowdy.  Follow-up with me as needed.  Total telephone time was 2 minutes.  As this was a postoperative visit, it is not billable as this is a part of the global fee.

## 2022-07-21 DIAGNOSIS — Z3493 Encounter for supervision of normal pregnancy, unspecified, third trimester: Secondary | ICD-10-CM | POA: Insufficient documentation

## 2022-08-20 DIAGNOSIS — O09899 Supervision of other high risk pregnancies, unspecified trimester: Secondary | ICD-10-CM | POA: Insufficient documentation

## 2022-08-20 DIAGNOSIS — Z2839 Other underimmunization status: Secondary | ICD-10-CM | POA: Insufficient documentation

## 2022-08-20 DIAGNOSIS — O99892 Other underimmunization status: Secondary | ICD-10-CM | POA: Insufficient documentation

## 2022-11-02 ENCOUNTER — Inpatient Hospital Stay (HOSPITAL_COMMUNITY)
Admission: AD | Admit: 2022-11-02 | Discharge: 2022-11-02 | Disposition: A | Discharge status: Home / Self Care | Payer: Medicaid Other | Attending: Family Medicine | Admitting: Family Medicine

## 2022-11-02 ENCOUNTER — Other Ambulatory Visit: Payer: Self-pay

## 2022-11-02 ENCOUNTER — Encounter (HOSPITAL_COMMUNITY): Payer: Self-pay | Admitting: Family Medicine

## 2022-11-02 DIAGNOSIS — O26892 Other specified pregnancy related conditions, second trimester: Secondary | ICD-10-CM | POA: Insufficient documentation

## 2022-11-02 DIAGNOSIS — Z3A27 27 weeks gestation of pregnancy: Secondary | ICD-10-CM | POA: Insufficient documentation

## 2022-11-02 DIAGNOSIS — O2312 Infections of bladder in pregnancy, second trimester: Secondary | ICD-10-CM | POA: Diagnosis not present

## 2022-11-02 DIAGNOSIS — O99891 Other specified diseases and conditions complicating pregnancy: Secondary | ICD-10-CM | POA: Diagnosis not present

## 2022-11-02 DIAGNOSIS — O99352 Diseases of the nervous system complicating pregnancy, second trimester: Secondary | ICD-10-CM | POA: Insufficient documentation

## 2022-11-02 DIAGNOSIS — G510 Bell's palsy: Secondary | ICD-10-CM | POA: Diagnosis not present

## 2022-11-02 DIAGNOSIS — N3001 Acute cystitis with hematuria: Secondary | ICD-10-CM | POA: Insufficient documentation

## 2022-11-02 HISTORY — DX: Bell's palsy: G51.0

## 2022-11-02 LAB — URINALYSIS, ROUTINE W REFLEX MICROSCOPIC
Bilirubin Urine: NEGATIVE
Glucose, UA: NEGATIVE mg/dL
Ketones, ur: NEGATIVE mg/dL
Nitrite: NEGATIVE
Protein, ur: 30 mg/dL — AB
RBC / HPF: >50 RBC/hpf — ABNORMAL HIGH (ref 0–5)
Specific Gravity, Urine: 1.023 (ref 1.005–1.030)
pH: 5 (ref 5.0–8.0)

## 2022-11-02 MED ORDER — HYDROCODONE-ACETAMINOPHEN 5-325 MG PO TABS: 1.0000 | ORAL_TABLET | ORAL | 0 refills | Status: DC | PRN

## 2022-11-02 MED ORDER — CEFADROXIL 500 MG PO CAPS: 500.0000 mg | ORAL_CAPSULE | Freq: Two times a day (BID) | ORAL | 0 refills | Status: DC

## 2022-11-02 NOTE — MAU Provider Note (Signed)
History     CSN: [id number]  Arrival date and time: 11/02/22 1141   Event Date/Time   First Provider Initiated Contact with Patient 11/02/22 1232      Chief Complaint  Patient presents with   Headache   Hematuria   HPI This is a 26 year old G2 P1-0-0-1.  She is at 27 weeks and 6 days.  Pregnancy is complicated by Bell's palsy which she developed following a 2-week upper respiratory infection.  Her Bell's palsy started about 6 days ago.  She has been on Valtrex and prednisone and is on day 5 of medications.  She presents with 2 days of worsening left ear and mandibular pain.  She has odynophagia.  No changes in her hearing.  She denies rash.  She also admits to dysuria with hematuria.    OB History     Gravida  2   Para  1   Term  1   Preterm  0   AB  0   Living  1      SAB  0   IAB  0   Ectopic  0   Multiple  0   Live Births  1           Past Medical History:  Diagnosis Date   Bell's palsy    Degenerative disc disease    Herniated disc    Sciatica     Past Surgical History:  Procedure Laterality Date   LAPAROSCOPIC APPENDECTOMY N/A 01/23/2021   Procedure: APPENDECTOMY LAPAROSCOPIC;  Surgeon: Aviva Signs, MD;  Location: [facility];  Service: General;  Laterality: N/A;    History reviewed. No pertinent family history.  Social History   Tobacco Use   Smoking status: Never   Smokeless tobacco: Never  Vaping Use   Vaping Use: Never used  Substance Use Topics   Alcohol use: No   Drug use: Never    Allergies:  Allergies  Allergen Reactions   Ultram [Tramadol] Nausea Only and Other (See Comments)    headache   Augmentin [Amoxicillin-Pot Clavulanate] Other (See Comments)    Unknown.   Cipro [Ciprofloxacin Hcl] Swelling and Rash    Arm started "getting red and swollen" 01/23/21    Medications Prior to Admission  Medication Sig Dispense Refill Last Dose   [DISCONTINUED] HYDROcodone-acetaminophen (NORCO) 5-325 MG tablet Take 1 tablet by mouth  every 4 (four) hours as needed for moderate pain. 30 tablet 0     Review of Systems Physical Exam   Blood pressure 116/64, pulse 79, temperature 98.2 F (36.8 C), temperature source Oral, resp. rate 16, height 5\' 3"  (1.6 m), weight 78.7 kg, SpO2 98 %, currently breastfeeding.  Physical Exam Vitals reviewed.  Constitutional:      Appearance: She is well-developed.  HENT:     Head: Normocephalic and atraumatic.      Right Ear: Tympanic membrane, ear canal and external ear normal.     Left Ear: Tympanic membrane, ear canal and external ear normal.  No middle ear effusion. There is no impacted cerumen. No foreign body. Tympanic membrane is not injected, scarred, perforated, erythematous, retracted or bulging.  Eyes:     Extraocular Movements:     Right eye: No nystagmus.     Left eye: Normal extraocular motion and no nystagmus.  Musculoskeletal:     Cervical back: Normal range of motion.  Neurological:     Mental Status: She is alert.     MAU Course  Procedures NST:  Baseline: 130  Variability: moderate Accelerations: none  Decelerations: none Contractions: none   MDM No evidence of Ramsay Hunt syndrome. Spoke with Dr. Jearld Fenton of ENT.  No further evaluation is needed.  Patient does need symptomatic treatment.  Assessment and Plan   1. [redacted] weeks gestation of pregnancy   2. Bell's palsy   3. Acute cystitis with hematuria    Treat UTI with cefadroxil.  Urine culture sent. Symptomatic treatment.  We will give limited Norco to help with pain.  No evidence of overlying infection.  Recommended follow-up with primary or ENT following treatment with prednisone and Valtrex.  Levie Heritage 11/02/2022, 1:25 PM

## 2022-11-02 NOTE — MAU Note (Signed)
Donna Reeves is a 26 y.o. at [redacted]w[redacted]d here in MAU reporting: last week was dx with Bell's Palsy by PCP and was started on valtrex and prednisone. 2 days ago started having left sided faced and ear pain and also a headache. Today saw some blood in her urine. +FM  Onset of complaint: ongoing  Pain score: head 9/10, abdomen 4/10  Vitals:   11/02/22 1209  BP: 113/60  Pulse: 72  Resp: 16  Temp: 98.2 F (36.8 C)  SpO2: 98%     FHT:144  Lab orders placed from triage: UA

## 2022-11-04 LAB — CULTURE, OB URINE: Culture: NO GROWTH

## 2022-11-27 ENCOUNTER — Other Ambulatory Visit: Payer: Self-pay | Admitting: Student

## 2022-11-27 DIAGNOSIS — G510 Bell's palsy: Secondary | ICD-10-CM

## 2022-12-07 ENCOUNTER — Ambulatory Visit: Payer: Medicaid Other

## 2022-12-08 ENCOUNTER — Ambulatory Visit
Admission: RE | Admit: 2022-12-08 | Discharge: 2022-12-08 | Disposition: A | Discharge status: Home / Self Care | Payer: Medicaid Other | Source: Ambulatory Visit | Attending: Student | Admitting: Student

## 2022-12-08 DIAGNOSIS — G510 Bell's palsy: Secondary | ICD-10-CM | POA: Insufficient documentation

## 2022-12-28 NOTE — L&D Delivery Note (Signed)
Delivery Note  First Stage: Labor onset: 1800 Augmentation : pitocin, AROM Analgesia /Anesthesia intrapartum: IV fentanyl, epidural AROM at 2010  Second Stage: Complete dilation at 0012 Onset of pushing at 0012 FHR second stage Category II tracing, recurrent variable decelerations  Delivery of a viable female infant 01/26/2023 at [facility] by Lucrezia Europe, CNM. delivery of fetal head in OA position with restitution to LOT. No nuchal cord;  Initial gentle downward traction attempt to deliver anterior shoulder unsuccessful. McRoberts maneuver caused anterior then posterior shoulders to be delivered easily with gentle downward traction. Baby placed on mom's chest, and attended to by peds.  Cord double clamped after cessation of pulsation, cut by FOB  Third Stage: Placenta delivered Delena Bali intact with 3 VC @ 0024 Placenta disposition: discarded Uterine tone firm / bleeding scant  1st degree laceration identified  Anesthesia for repair: n/a Repair not needed, tear hemostatic and approximated Est. Blood Loss (mL): 71IR  Complications: none  Mom to postpartum.  Baby to Couplet care / Skin to Skin.  Newborn: Birth Weight: 8lb 1.8oz Apgar Scores: 9, 9 Feeding planned: breastfeeding

## 2023-01-16 ENCOUNTER — Encounter: Payer: Self-pay | Admitting: Obstetrics and Gynecology

## 2023-01-16 ENCOUNTER — Observation Stay
Admission: EM | Admit: 2023-01-16 | Discharge: 2023-01-16 | Disposition: A | Discharge status: Home / Self Care | Payer: Medicaid Other | Attending: Obstetrics and Gynecology | Admitting: Obstetrics and Gynecology

## 2023-01-16 ENCOUNTER — Other Ambulatory Visit: Payer: Self-pay

## 2023-01-16 DIAGNOSIS — O26893 Other specified pregnancy related conditions, third trimester: Secondary | ICD-10-CM | POA: Diagnosis present

## 2023-01-16 DIAGNOSIS — R519 Headache, unspecified: Secondary | ICD-10-CM | POA: Diagnosis not present

## 2023-01-16 DIAGNOSIS — M545 Low back pain, unspecified: Secondary | ICD-10-CM | POA: Insufficient documentation

## 2023-01-16 DIAGNOSIS — Z3A38 38 weeks gestation of pregnancy: Secondary | ICD-10-CM | POA: Insufficient documentation

## 2023-01-16 DIAGNOSIS — Z79899 Other long term (current) drug therapy: Secondary | ICD-10-CM | POA: Diagnosis not present

## 2023-01-16 LAB — URINALYSIS, COMPLETE (UACMP) WITH MICROSCOPIC
Bilirubin Urine: NEGATIVE
Glucose, UA: NEGATIVE mg/dL
Hgb urine dipstick: NEGATIVE
Ketones, ur: NEGATIVE mg/dL
Nitrite: NEGATIVE
Protein, ur: NEGATIVE mg/dL
Specific Gravity, Urine: 1.013 (ref 1.005–1.030)
pH: 6 (ref 5.0–8.0)

## 2023-01-16 LAB — CBC WITH DIFFERENTIAL/PLATELET
Abs Immature Granulocytes: 0.03 10*3/uL (ref 0.00–0.07)
Basophils Absolute: 0 10*3/uL (ref 0.0–0.1)
Basophils Relative: 0 %
Eosinophils Absolute: 0.1 10*3/uL (ref 0.0–0.5)
Eosinophils Relative: 1 %
HCT: 29.9 % — ABNORMAL LOW (ref 36.0–46.0)
Hemoglobin: 10.4 g/dL — ABNORMAL LOW (ref 12.0–15.0)
Immature Granulocytes: 0 %
Lymphocytes Relative: 19 %
Lymphs Abs: 1.3 10*3/uL (ref 0.7–4.0)
MCH: 29.9 pg (ref 26.0–34.0)
MCHC: 34.8 g/dL (ref 30.0–36.0)
MCV: 85.9 fL (ref 80.0–100.0)
Monocytes Absolute: 0.6 10*3/uL (ref 0.1–1.0)
Monocytes Relative: 8 %
Neutro Abs: 5.1 10*3/uL (ref 1.7–7.7)
Neutrophils Relative %: 72 %
Platelets: 262 10*3/uL (ref 150–400)
RBC: 3.48 MIL/uL — ABNORMAL LOW (ref 3.87–5.11)
RDW: 12.3 % (ref 11.5–15.5)
WBC: 7.1 10*3/uL (ref 4.0–10.5)
nRBC: 0 % (ref 0.0–0.2)

## 2023-01-16 LAB — COMPREHENSIVE METABOLIC PANEL
ALT: 14 U/L (ref 0–44)
AST: 18 U/L (ref 15–41)
Albumin: 2.9 g/dL — ABNORMAL LOW (ref 3.5–5.0)
Alkaline Phosphatase: 155 U/L — ABNORMAL HIGH (ref 38–126)
Anion gap: 8 (ref 5–15)
BUN: 7 mg/dL (ref 6–20)
CO2: 22 mmol/L (ref 22–32)
Calcium: 8.5 mg/dL — ABNORMAL LOW (ref 8.9–10.3)
Chloride: 105 mmol/L (ref 98–111)
Creatinine, Ser: 0.54 mg/dL (ref 0.44–1.00)
GFR, Estimated: >60 mL/min (ref >60)
Glucose, Bld: 116 mg/dL — ABNORMAL HIGH (ref 70–99)
Potassium: 3.3 mmol/L — ABNORMAL LOW (ref 3.5–5.1)
Sodium: 135 mmol/L (ref 135–145)
Total Bilirubin: 0.6 mg/dL (ref 0.3–1.2)
Total Protein: 6.4 g/dL — ABNORMAL LOW (ref 6.5–8.1)

## 2023-01-16 LAB — PROTEIN / CREATININE RATIO, URINE
Creatinine, Urine: 105 mg/dL
Protein Creatinine Ratio: 0.12 mg/mg{Cre} (ref 0.00–0.15)
Total Protein, Urine: 13 mg/dL

## 2023-01-16 MED ORDER — LABETALOL HCL 5 MG/ML IV SOLN: 20.0000 mg | INTRAVENOUS | Status: DC | PRN

## 2023-01-16 MED ORDER — LABETALOL HCL 5 MG/ML IV SOLN: 40.0000 mg | INTRAVENOUS | Status: DC | PRN

## 2023-01-16 MED ORDER — LABETALOL HCL 5 MG/ML IV SOLN: 80.0000 mg | INTRAVENOUS | Status: DC | PRN

## 2023-01-16 MED ORDER — HYDRALAZINE HCL 20 MG/ML IJ SOLN: 10.0000 mg | INTRAMUSCULAR | Status: DC | PRN

## 2023-01-16 NOTE — Progress Notes (Signed)
Discharge instructions provided to patient. Patient verbalized understanding. Pt educated on signs and symptoms of labor, vaginal bleeding, LOF, and fetal movement. Red flag signs reviewed by RN. Patient discharged home with her mother in stable condition. 

## 2023-01-16 NOTE — OB Triage Note (Signed)
Patient is a 27 yo, G2P1, at 38 weeks 4 days. Patient presents with complaints of abdominal pain in the right upper quadrant that is constant rated 7/10.  Patient reports occasional headache, seeing flashes of light about two weeks ago, and swelling in her lower extremities. Patient denies any regular or consistent contractions. Patient denies any vaginal bleeding or LOF. Patient reports +FM. Monitors applied and assessing. VSS. Initial fetal heart tone 150. Bobette Mo CNM notified of patients arrival to unit. Plan to place in observation for labs and fetal monitoring.

## 2023-01-16 NOTE — Discharge Summary (Signed)
Donna Reeves is a 27 y.o. female. She is at [redacted]w[redacted]d gestation. No LMP recorded. Patient is pregnant. Estimated Date of Delivery: 01/26/23  Prenatal care site: [facility]  Chief complaint: Headaches intermittent since November, also having intermittent lower back pain.  HPI: Donna Reeves presents to L&D with complaints of intermittent HA's and back pain  Factors complicating pregnancy: Rubella non immune Varicella non immune Bell's Palsy diagnosed at 29 weeks  S: Resting comfortably. no CTX, no VB.no LOF,  Active fetal movement.   Maternal Medical History:  Past Medical Hx:  has a past medical history of Bell's palsy, Degenerative disc disease, Herniated disc, and Sciatica.    Past Surgical Hx:  has a past surgical history that includes laparoscopic appendectomy (N/A, 01/23/2021).   Allergies  Allergen Reactions   Ultram [Tramadol] Nausea Only and Other (See Comments)    headache   Augmentin [Amoxicillin-Pot Clavulanate] Other (See Comments)    Unknown.   Cipro [Ciprofloxacin Hcl] Swelling and Rash    Arm started "getting red and swollen" 01/23/21     Prior to Admission medications   Medication Sig Start Date End Date Taking? Authorizing Provider  cefadroxil (DURICEF) 500 MG capsule Take 1 capsule (500 mg total) by mouth 2 (two) times daily. 11/02/22   Truett Mainland, DO  HYDROcodone-acetaminophen (NORCO) 5-325 MG tablet Take 1 tablet by mouth every 4 (four) hours as needed for moderate pain. 11/02/22   Truett Mainland, DO    Social History: She  reports that she has never smoked. She has never used smokeless tobacco. She reports that she does not drink alcohol and does not use drugs.  Family History: family history is not on file. ,no history of gyn cancers  Review of Systems: A full review of systems was performed and negative except as noted in the HPI.    O:  BP 114/66 (BP Location: Left Arm)   Pulse 82   Temp 98 F (36.7 C) (Oral)   Resp 18  Results for  orders placed or performed during the hospital encounter of 01/16/23 (from the past 48 hour(s))  CBC with Differential/Platelet   Collection Time: 01/16/23  1:02 PM  Result Value Ref Range   WBC 7.1 4.0 - 10.5 K/uL   RBC 3.48 (L) 3.87 - 5.11 MIL/uL   Hemoglobin 10.4 (L) 12.0 - 15.0 g/dL   HCT 29.9 (L) 36.0 - 46.0 %   MCV 85.9 80.0 - 100.0 fL   MCH 29.9 26.0 - 34.0 pg   MCHC 34.8 30.0 - 36.0 g/dL   RDW 12.3 11.5 - 15.5 %   Platelets 262 150 - 400 K/uL   nRBC 0.0 0.0 - 0.2 %   Neutrophils Relative % 72 %   Neutro Abs 5.1 1.7 - 7.7 K/uL   Lymphocytes Relative 19 %   Lymphs Abs 1.3 0.7 - 4.0 K/uL   Monocytes Relative 8 %   Monocytes Absolute 0.6 0.1 - 1.0 K/uL   Eosinophils Relative 1 %   Eosinophils Absolute 0.1 0.0 - 0.5 K/uL   Basophils Relative 0 %   Basophils Absolute 0.0 0.0 - 0.1 K/uL   Immature Granulocytes 0 %   Abs Immature Granulocytes 0.03 0.00 - 0.07 K/uL  Comprehensive metabolic panel   Collection Time: 01/16/23  1:02 PM  Result Value Ref Range   Sodium 135 135 - 145 mmol/L   Potassium 3.3 (L) 3.5 - 5.1 mmol/L   Chloride 105 98 - 111 mmol/L   CO2 22 22 -  32 mmol/L   Glucose, Bld 116 (H) 70 - 99 mg/dL   BUN 7 6 - 20 mg/dL   Creatinine, Ser 0.54 0.44 - 1.00 mg/dL   Calcium 8.5 (L) 8.9 - 10.3 mg/dL   Total Protein 6.4 (L) 6.5 - 8.1 g/dL   Albumin 2.9 (L) 3.5 - 5.0 g/dL   AST 18 15 - 41 U/L   ALT 14 0 - 44 U/L   Alkaline Phosphatase 155 (H) 38 - 126 U/L   Total Bilirubin 0.6 0.3 - 1.2 mg/dL   GFR, Estimated >60 >60 mL/min   Anion gap 8 5 - 15  Protein / creatinine ratio, urine   Collection Time: 01/16/23  1:10 PM  Result Value Ref Range   Creatinine, Urine 105 mg/dL   Total Protein, Urine 13 mg/dL   Protein Creatinine Ratio 0.12 0.00 - 0.15 mg/mg[Cre]  Urinalysis, Complete w Microscopic Urine, Clean Catch   Collection Time: 01/16/23  1:10 PM  Result Value Ref Range   Color, Urine YELLOW (A) YELLOW   APPearance HAZY (A) CLEAR   Specific Gravity, Urine  1.013 1.005 - 1.030   pH 6.0 5.0 - 8.0   Glucose, UA NEGATIVE NEGATIVE mg/dL   Hgb urine dipstick NEGATIVE NEGATIVE   Bilirubin Urine NEGATIVE NEGATIVE   Ketones, ur NEGATIVE NEGATIVE mg/dL   Protein, ur NEGATIVE NEGATIVE mg/dL   Nitrite NEGATIVE NEGATIVE   Leukocytes,Ua TRACE (A) NEGATIVE   RBC / HPF 0-5 0 - 5 RBC/hpf   WBC, UA 0-5 0 - 5 WBC/hpf   Bacteria, UA RARE (A) NONE SEEN   Squamous Epithelial / HPF 11-20 0 - 5 /HPF   Mucus PRESENT      Constitutional: NAD, AAOx3  HE/ENT: extraocular movements grossly intact, moist mucous membranes CV: RRR PULM: nl respiratory effort, CTABL Abd: gravid, non-tender, non-distended, soft  Ext: Non-tender, Nonedmeatous Psych: mood appropriate, speech normal Pelvic : deferred SVE:     Fetal Monitor: Baseline: 130 bpm Variability: moderate Accels: Present Decels: none Toco: none  Category: I   Assessment: 27 y.o. [redacted]w[redacted]d here for antenatal surveillance during pregnancy.  Principle diagnosis: HA in pregnancy There were no encounter diagnoses.   Plan: Labor: not present.  Fetal Wellbeing: Reassuring Cat 1 tracing. Reactive NST  Continue taking antibiotics for UTI BP/pre-e labs wnl D/c home stable, precautions reviewed, follow-up as scheduled.   ----- Avelino Leeds, CNM Certified Nurse Midwife [facility]

## 2023-01-21 ENCOUNTER — Encounter: Payer: Self-pay | Admitting: Obstetrics and Gynecology

## 2023-01-21 ENCOUNTER — Observation Stay
Admission: EM | Admit: 2023-01-21 | Discharge: 2023-01-21 | Disposition: A | Discharge status: Home / Self Care | Payer: Medicaid Other | Attending: Obstetrics and Gynecology | Admitting: Obstetrics and Gynecology

## 2023-01-21 ENCOUNTER — Other Ambulatory Visit: Payer: Self-pay

## 2023-01-21 DIAGNOSIS — O368130 Decreased fetal movements, third trimester, not applicable or unspecified: Principal | ICD-10-CM | POA: Diagnosis present

## 2023-01-21 DIAGNOSIS — Z3A39 39 weeks gestation of pregnancy: Secondary | ICD-10-CM | POA: Diagnosis not present

## 2023-01-21 MED ORDER — ACETAMINOPHEN 500 MG PO TABS: 1000.0000 mg | ORAL_TABLET | Freq: Four times a day (QID) | ORAL | Status: DC | PRN

## 2023-01-21 MED ORDER — CALCIUM CARBONATE ANTACID 500 MG PO CHEW: 2.0000 | CHEWABLE_TABLET | ORAL | Status: DC | PRN

## 2023-01-21 NOTE — OB Triage Note (Signed)
Patient is a 27 yo, G2P1, at 39 weeks 2 days. Patient presents with complaints of decreased fetal movement.  Patient denies any vaginal bleeding or LOF.Patient denies any regular or consistent contractions at this time but reports mild cramping after having her membranes swept in the office today. Monitors applied and assessing. VSS. Initial fetal heart tone 115. Mackie,CNM notified of patients arrival to unit. Plan to place in observation for fetal monitoring. Pt was provided with a snack and juice and positioned on her left side.

## 2023-01-21 NOTE — Progress Notes (Signed)
Discharge instructions provided to patient. Patient verbalized understanding. Pt educated on signs and symptoms of labor, vaginal bleeding, LOF, and fetal movement. Red flag signs reviewed by RN. Patient discharged home with her sister in stable condition.

## 2023-01-21 NOTE — Discharge Summary (Signed)
Donna Reeves is a 27 y.o. female. She is at [redacted]w[redacted]d gestation. No LMP recorded. Patient is pregnant. Estimated Date of Delivery: 01/26/23    Prenatal care site: [place] [facility]   Chief Complaint: decreased fetal movement over the past couple of days.  Reports that she's been busy today and has not eaten today.  States that baby is moving more now since arrival to L&D.   S: Resting comfortably. no CTX, no VB.no LOF,  Active fetal movement.    Maternal Medical History:  Past Medical Hx:  has a past medical history of Bell's palsy, Degenerative disc disease, Herniated disc, and Sciatica.  Past Surgical Hx:  has a past surgical history that includes laparoscopic appendectomy (N/A, 01/23/2021).   Allergies  Allergen Reactions   Ultram [Tramadol] Nausea Only and Other (See Comments)    headache   Augmentin [Amoxicillin-Pot Clavulanate] Other (See Comments)    Unknown.   Cipro [Ciprofloxacin Hcl] Swelling and Rash    Arm started "getting red and swollen" 01/23/21    Prior to Admission medications   Medication Sig Start Date End Date Taking? Authorizing Provider  cefadroxil (DURICEF) 500 MG capsule Take 1 capsule (500 mg total) by mouth 2 (two) times daily. Patient not taking: Reported on 01/16/2023 11/02/22   Truett Mainland, DO  HYDROcodone-acetaminophen [facility]) 5-325 MG tablet Take 1 tablet by mouth every 4 (four) hours as needed for moderate pain. Patient not taking: Reported on 01/16/2023 11/02/22   Truett Mainland, DO  ondansetron (ZOFRAN) 4 MG tablet Take 4 mg by mouth every 8 (eight) hours as needed for nausea or vomiting.    [provider]     Social History: She  reports that she has never smoked. She has never used smokeless tobacco. She reports that she does not drink alcohol and does not use drugs.  Family History: family history non-contributory   Review of Systems: A full review of systems was performed and negative except as noted in the HPI.     O:   BP 124/77 (BP Location: Left Arm)   Pulse 78   Temp 98.3 F (36.8 C) (Oral)   Resp 16   Ht 5\' 3"  (1.6 m)   Wt 81.6 kg   BMI 31.89 kg/m  No results found for this or any previous visit (from the past 48 hour(s)).   Constitutional: NAD, AAOx3  HE/ENT: extraocular movements grossly intact, moist mucous membranes CV: RRR PULM: nl respiratory effort, CTABL     Abd: gravid, non-tender, non-distended, soft      Ext: Non-tender, Nonedmeatous   Psych: mood appropriate, speech normal Pelvic: deferred   NST: Baseline FHR: 120 beats/min Variability: moderate Accelerations: present Decelerations: absent Tocometry: irregular, mild contractions   Interpretation:  INDICATIONS: decreased fetal movement RESULTS:  A NST procedure was performed with FHR monitoring and a normal baseline established, appropriate time of 20-40 minutes of evaluation, and accels >2 seen w 15x15 characteristics.  Results show a REACTIVE NST.     A/P: 27 y.o. [redacted]w[redacted]d with high risk pregnancy and antepartum surveillance.  Fetal Wellbeing: Reassuring Cat 1 tracing. Active fetal movement now reported by Donna Reeves and auscultated on EFM.  Kick counts reviewed.  NST reviewed, reactive NST  D/c home stable, precautions reviewed, follow-up as scheduled in 1 week for prenatal appointment.   ----- Drinda Butts, CNM Certified Nurse Midwife [facility]

## 2023-01-25 ENCOUNTER — Encounter: Payer: Self-pay | Admitting: Obstetrics and Gynecology

## 2023-01-25 ENCOUNTER — Inpatient Hospital Stay: Payer: Medicaid Other | Admitting: Anesthesiology

## 2023-01-25 ENCOUNTER — Inpatient Hospital Stay
Admission: EM | Admit: 2023-01-25 | Discharge: 2023-01-27 | DRG: 807 | Disposition: A | Discharge status: Home / Self Care | Payer: Medicaid Other

## 2023-01-25 ENCOUNTER — Other Ambulatory Visit: Payer: Self-pay

## 2023-01-25 DIAGNOSIS — Z3A39 39 weeks gestation of pregnancy: Secondary | ICD-10-CM

## 2023-01-25 DIAGNOSIS — O368130 Decreased fetal movements, third trimester, not applicable or unspecified: Secondary | ICD-10-CM | POA: Diagnosis present

## 2023-01-25 DIAGNOSIS — O368190 Decreased fetal movements, unspecified trimester, not applicable or unspecified: Secondary | ICD-10-CM | POA: Diagnosis present

## 2023-01-25 DIAGNOSIS — O163 Unspecified maternal hypertension, third trimester: Principal | ICD-10-CM | POA: Diagnosis present

## 2023-01-25 DIAGNOSIS — O26893 Other specified pregnancy related conditions, third trimester: Secondary | ICD-10-CM | POA: Diagnosis present

## 2023-01-25 DIAGNOSIS — R030 Elevated blood-pressure reading, without diagnosis of hypertension: Secondary | ICD-10-CM | POA: Diagnosis present

## 2023-01-25 LAB — COMPREHENSIVE METABOLIC PANEL
ALT: 14 U/L (ref 0–44)
AST: 17 U/L (ref 15–41)
Albumin: 2.9 g/dL — ABNORMAL LOW (ref 3.5–5.0)
Alkaline Phosphatase: 185 U/L — ABNORMAL HIGH (ref 38–126)
Anion gap: 9 (ref 5–15)
BUN: 9 mg/dL (ref 6–20)
CO2: 20 mmol/L — ABNORMAL LOW (ref 22–32)
Calcium: 8.8 mg/dL — ABNORMAL LOW (ref 8.9–10.3)
Chloride: 104 mmol/L (ref 98–111)
Creatinine, Ser: 0.51 mg/dL (ref 0.44–1.00)
GFR, Estimated: >60 mL/min (ref >60)
Glucose, Bld: 79 mg/dL (ref 70–99)
Potassium: 3.4 mmol/L — ABNORMAL LOW (ref 3.5–5.1)
Sodium: 133 mmol/L — ABNORMAL LOW (ref 135–145)
Total Bilirubin: 0.7 mg/dL (ref 0.3–1.2)
Total Protein: 6.5 g/dL (ref 6.5–8.1)

## 2023-01-25 LAB — CBC WITH DIFFERENTIAL/PLATELET
Abs Immature Granulocytes: 0.03 10*3/uL (ref 0.00–0.07)
Basophils Absolute: 0 10*3/uL (ref 0.0–0.1)
Basophils Relative: 0 %
Eosinophils Absolute: 0.1 10*3/uL (ref 0.0–0.5)
Eosinophils Relative: 1 %
HCT: 29.7 % — ABNORMAL LOW (ref 36.0–46.0)
Hemoglobin: 10.5 g/dL — ABNORMAL LOW (ref 12.0–15.0)
Immature Granulocytes: 0 %
Lymphocytes Relative: 20 %
Lymphs Abs: 1.5 10*3/uL (ref 0.7–4.0)
MCH: 30.2 pg (ref 26.0–34.0)
MCHC: 35.4 g/dL (ref 30.0–36.0)
MCV: 85.3 fL (ref 80.0–100.0)
Monocytes Absolute: 0.6 10*3/uL (ref 0.1–1.0)
Monocytes Relative: 8 %
Neutro Abs: 5.2 10*3/uL (ref 1.7–7.7)
Neutrophils Relative %: 71 %
Platelets: 265 10*3/uL (ref 150–400)
RBC: 3.48 MIL/uL — ABNORMAL LOW (ref 3.87–5.11)
RDW: 12.6 % (ref 11.5–15.5)
WBC: 7.4 10*3/uL (ref 4.0–10.5)
nRBC: 0 % (ref 0.0–0.2)

## 2023-01-25 LAB — TYPE AND SCREEN
ABO/RH(D): A POS
Antibody Screen: NEGATIVE

## 2023-01-25 LAB — PROTEIN / CREATININE RATIO, URINE
Creatinine, Urine: 45 mg/dL
Protein Creatinine Ratio: 0.2 mg/mg{Cre} — ABNORMAL HIGH (ref 0.00–0.15)
Total Protein, Urine: 9 mg/dL

## 2023-01-25 MED ORDER — EPHEDRINE 5 MG/ML INJ: 10.0000 mg | INTRAVENOUS | Status: DC | PRN

## 2023-01-25 MED ORDER — CALCIUM CARBONATE ANTACID 500 MG PO CHEW: 2.0000 | CHEWABLE_TABLET | ORAL | Status: DC | PRN

## 2023-01-25 MED ORDER — LACTATED RINGERS IV SOLN: INTRAVENOUS | Status: DC

## 2023-01-25 MED ORDER — LIDOCAINE-EPINEPHRINE (PF) 1.5 %-1:200000 IJ SOLN
INTRAMUSCULAR | Status: DC | PRN
  Administered 2023-01-25: 4 mL via EPIDURAL

## 2023-01-25 MED ORDER — OXYTOCIN-SODIUM CHLORIDE 30-0.9 UT/500ML-% IV SOLN
1.0000 m[IU]/min | INTRAVENOUS | Status: DC
  Administered 2023-01-25: 2 m[IU]/min via INTRAVENOUS
  Filled 2023-01-25: qty 1000

## 2023-01-25 MED ORDER — FENTANYL-BUPIVACAINE-NACL 0.5-0.125-0.9 MG/250ML-% EP SOLN
EPIDURAL | Status: AC
  Filled 2023-01-25: qty 250

## 2023-01-25 MED ORDER — LIDOCAINE HCL (PF) 1 % IJ SOLN: 30.0000 mL | INTRAMUSCULAR | Status: DC | PRN

## 2023-01-25 MED ORDER — LIDOCAINE HCL (PF) 1 % IJ SOLN
INTRAMUSCULAR | Status: DC | PRN
  Administered 2023-01-25: 3 mL via SUBCUTANEOUS

## 2023-01-25 MED ORDER — LACTATED RINGERS IV SOLN: 500.0000 mL | INTRAVENOUS | Status: DC | PRN

## 2023-01-25 MED ORDER — FENTANYL-BUPIVACAINE-NACL 0.5-0.125-0.9 MG/250ML-% EP SOLN
12.0000 mL/h | EPIDURAL | Status: DC | PRN
  Administered 2023-01-25: 12 mL/h via EPIDURAL

## 2023-01-25 MED ORDER — OXYTOCIN BOLUS FROM INFUSION: 333.0000 mL | Freq: Once | INTRAVENOUS | Status: DC

## 2023-01-25 MED ORDER — PHENYLEPHRINE 80 MCG/ML (10ML) SYRINGE FOR IV PUSH (FOR BLOOD PRESSURE SUPPORT): 80.0000 ug | PREFILLED_SYRINGE | INTRAVENOUS | Status: DC | PRN

## 2023-01-25 MED ORDER — SODIUM CHLORIDE 0.9 % IV SOLN
INTRAVENOUS | Status: DC | PRN
  Administered 2023-01-25: 4 mL via EPIDURAL
  Administered 2023-01-25: 5 mL via EPIDURAL

## 2023-01-25 MED ORDER — FAMOTIDINE 20 MG PO TABS: 20.0000 mg | ORAL_TABLET | Freq: Two times a day (BID) | ORAL | Status: DC

## 2023-01-25 MED ORDER — DIPHENHYDRAMINE HCL 50 MG/ML IJ SOLN: 12.5000 mg | INTRAMUSCULAR | Status: DC | PRN

## 2023-01-25 MED ORDER — FAMOTIDINE 20 MG PO TABS
ORAL_TABLET | ORAL | Status: AC
  Filled 2023-01-25: qty 1

## 2023-01-25 MED ORDER — FENTANYL CITRATE (PF) 100 MCG/2ML IJ SOLN
50.0000 ug | INTRAMUSCULAR | Status: DC | PRN
  Administered 2023-01-25 (×2): 100 ug via INTRAVENOUS
  Filled 2023-01-25 (×2): qty 2

## 2023-01-25 MED ORDER — OXYTOCIN-SODIUM CHLORIDE 30-0.9 UT/500ML-% IV SOLN: 2.5000 [IU]/h | INTRAVENOUS | Status: DC

## 2023-01-25 MED ORDER — ACETAMINOPHEN 325 MG PO TABS: 650.0000 mg | ORAL_TABLET | ORAL | Status: DC | PRN

## 2023-01-25 MED ORDER — ONDANSETRON HCL 4 MG/2ML IJ SOLN: 4.0000 mg | Freq: Four times a day (QID) | INTRAMUSCULAR | Status: DC | PRN

## 2023-01-25 MED ORDER — ZOLPIDEM TARTRATE 5 MG PO TABS: 5.0000 mg | ORAL_TABLET | Freq: Every evening | ORAL | Status: DC | PRN

## 2023-01-25 MED ORDER — SOD CITRATE-CITRIC ACID 500-334 MG/5ML PO SOLN: 30.0000 mL | ORAL | Status: DC | PRN

## 2023-01-25 MED ORDER — TERBUTALINE SULFATE 1 MG/ML IJ SOLN: 0.2500 mg | Freq: Once | INTRAMUSCULAR | Status: DC | PRN

## 2023-01-25 MED ORDER — LACTATED RINGERS IV SOLN: 500.0000 mL | Freq: Once | INTRAVENOUS | Status: DC

## 2023-01-25 NOTE — Progress Notes (Signed)
Labor Progress Note  Donna Reeves is a 27 y.o. G2P1001 at [redacted]w[redacted]d by LMP admitted for induction of labor due to decreased fetal movement at term, elevated blood pressures.  Subjective: she reports contractions are stronger and is agreeable to AROM  Objective: BP 133/87   Pulse 72   Temp 98.6 F (37 C) (Oral)   Resp 17   Ht 5\' 3"  (1.6 m)   Wt 81.6 kg   BMI 31.89 kg/m  Notable VS details: reviewed  Fetal Assessment: FHT:  FHR: 125 bpm, variability: moderate,  accelerations:  Present,  decelerations:  Absent Category/reactivity:  Category I UC:   regular, every 2-3 minutes SVE:    Dilation: 6.5cm  Effacement: 80%  Station:  0  Consistency: soft  Position: middle  Membrane status:AROM @ 2010 Amniotic color: clear  Labs: Lab Results  Component Value Date   WBC 7.4 01/25/2023   HGB 10.5 (L) 01/25/2023   HCT 29.7 (L) 01/25/2023   MCV 85.3 01/25/2023   PLT 265 01/25/2023    Assessment / Plan: Induction of labor due to decreased fetal movement and elevated blood pressure,  progressing well on pitocin  Labor: Progressing normally Preeclampsia:  labs stable Fetal Wellbeing:  Category I Pain Control:  Labor support without medications I/D:   GBS negative Anticipated MOD:  NSVD  Gertie Fey, CNM 01/25/2023, 8:32 PM

## 2023-01-25 NOTE — H&P (Signed)
OB History & Physical   History of Present Illness:  Chief Complaint:   HPI:  Brycelyn Pakula is a 27 y.o. G36P1001 female at [redacted]w[redacted]d dated by LMP.  She presents to L&D for evaluation for decreased fetal movement and elevated blood pressures. Decision made to induce labor.   Pregnancy Issues: 1. Bell's palsy 2. Rubella and varicella non-immune 3. Vaginal varicosities  Maternal Medical History:   Past Medical History:  Diagnosis Date   Bell's palsy    Degenerative disc disease    Herniated disc    Sciatica     Past Surgical History:  Procedure Laterality Date   LAPAROSCOPIC APPENDECTOMY N/A 01/23/2021   Procedure: APPENDECTOMY LAPAROSCOPIC;  Surgeon: Aviva Signs, MD;  Location: [facility];  Service: General;  Laterality: N/A;    Allergies  Allergen Reactions   Ultram [Tramadol] Nausea Only and Other (See Comments)    headache   Augmentin [Amoxicillin-Pot Clavulanate] Other (See Comments)    Unknown.   Cipro [Ciprofloxacin Hcl] Swelling and Rash    Arm started "getting red and swollen" 01/23/21    Prior to Admission medications   Medication Sig Start Date End Date Taking? Authorizing Provider  ondansetron (ZOFRAN) 4 MG tablet Take 4 mg by mouth every 8 (eight) hours as needed for nausea or vomiting.   Yes [provider]    Prenatal care site: [place] History: She  reports that she has never smoked. She has never used smokeless tobacco. She reports that she does not drink alcohol and does not use drugs.  Family History: family history is not on file.   Review of Systems: A full review of systems was performed and negative except as noted in the HPI.     Physical Exam:  Vital Signs: BP 133/87   Pulse 72   Temp 98.6 F (37 C) (Oral)   Resp 17   Ht 5\' 3"  (1.6 m)   Wt 81.6 kg   BMI 31.89 kg/m  General: no acute distress.  HEENT: normocephalic, atraumatic Heart: regular rate & rhythm.  No murmurs/rubs/gallops Lungs: clear to  auscultation bilaterally, normal respiratory effort Abdomen: soft, gravid, non-tender;  EFW: 7.5lb Pelvic:   External: Normal external female genitalia  Cervix: 6/80/0   Extremities: non-tender, symmetric, no edema bilaterally.  DTRs: +2  Neurologic: Alert & oriented x 3.    Results for orders placed or performed during the hospital encounter of 01/25/23 (from the past 24 hour(s))  Protein / creatinine ratio, urine     Status: Abnormal   Collection Time: 01/25/23  5:09 PM  Result Value Ref Range   Creatinine, Urine 45 mg/dL   Total Protein, Urine 9 mg/dL   Protein Creatinine Ratio 0.20 (H) 0.00 - 0.15 mg/mg[Cre]  CBC with Differential/Platelet     Status: Abnormal   Collection Time: 01/25/23  5:09 PM  Result Value Ref Range   WBC 7.4 4.0 - 10.5 K/uL   RBC 3.48 (L) 3.87 - 5.11 MIL/uL   Hemoglobin 10.5 (L) 12.0 - 15.0 g/dL   HCT 29.7 (L) 36.0 - 46.0 %   MCV 85.3 80.0 - 100.0 fL   MCH 30.2 26.0 - 34.0 pg   MCHC 35.4 30.0 - 36.0 g/dL   RDW 12.6 11.5 - 15.5 %   Platelets 265 150 - 400 K/uL   nRBC 0.0 0.0 - 0.2 %   Neutrophils Relative % 71 %   Neutro Abs 5.2 1.7 - 7.7 K/uL   Lymphocytes Relative 20 %  Lymphs Abs 1.5 0.7 - 4.0 K/uL   Monocytes Relative 8 %   Monocytes Absolute 0.6 0.1 - 1.0 K/uL   Eosinophils Relative 1 %   Eosinophils Absolute 0.1 0.0 - 0.5 K/uL   Basophils Relative 0 %   Basophils Absolute 0.0 0.0 - 0.1 K/uL   Immature Granulocytes 0 %   Abs Immature Granulocytes 0.03 0.00 - 0.07 K/uL  Comprehensive metabolic panel     Status: Abnormal   Collection Time: 01/25/23  5:09 PM  Result Value Ref Range   Sodium 133 (L) 135 - 145 mmol/L   Potassium 3.4 (L) 3.5 - 5.1 mmol/L   Chloride 104 98 - 111 mmol/L   CO2 20 (L) 22 - 32 mmol/L   Glucose, Bld 79 70 - 99 mg/dL   BUN 9 6 - 20 mg/dL   Creatinine, Ser 0.51 0.44 - 1.00 mg/dL   Calcium 8.8 (L) 8.9 - 10.3 mg/dL   Total Protein 6.5 6.5 - 8.1 g/dL   Albumin 2.9 (L) 3.5 - 5.0 g/dL   AST 17 15 - 41 U/L   ALT 14 0  - 44 U/L   Alkaline Phosphatase 185 (H) 38 - 126 U/L   Total Bilirubin 0.7 0.3 - 1.2 mg/dL   GFR, Estimated >60 >60 mL/min   Anion gap 9 5 - 15  Type and screen [place]     Status: None   Collection Time: 01/25/23  5:09 PM  Result Value Ref Range   ABO/RH(D) A POS    Antibody Screen NEG    Sample Expiration      01/28/2023,2359 Performed at [facility], [address], [place], [place] [address]     Pertinent Results:  Prenatal Labs: Blood type/Rh A pos  Antibody screen neg  Rubella Non-Immune  Varicella Non-Immune  RPR NR  HBsAg Neg  HIV NR  GC neg  Chlamydia neg  Genetic screening negative  1 hour GTT 101  3 hour GTT   GBS negative   FHT: 120bpm, moderate variability, accelerations present, no decelerations, Category I tracing TOCO: occasional, moderate to palpation SVE:  1/44/3   Cephalic by leopolds  No results found.  Assessment:  Kanika Bungert is a 27 y.o. G33P1001 female at [redacted]w[redacted]d with IOL for decreased fetal movement at term and elevated BP.   Plan:  1. Admit to Labor & Delivery; consents reviewed and obtained  2. Fetal Well being  - Fetal Tracing: Category I tracing - Group B Streptococcus ppx indicated: n/a, GBS negative - Presentation: vertex confirmed by SVE   3. Routine OB: - Prenatal labs reviewed, as above - Rh positive - CBC, T&S, RPR on admit - Clear fluids, IVF  4. Induction of Labor -  Contractions occasional, external toco in place -  Pelvis proven to 2860g -  Plan for induction with pitocin, AROM -  Plan for continuous fetal monitoring  -  Maternal pain control as desired; requesting unmedicated options - Anticipate vaginal delivery  5. Post Partum Planning: - Infant feeding: breastfeeding - Contraception: TBD  Gertie Fey, CNM 01/25/23 8:24 PM

## 2023-01-25 NOTE — Anesthesia Preprocedure Evaluation (Signed)
Anesthesia Evaluation  Patient identified by MRN, date of birth, ID band Patient awake    Reviewed: Allergy & Precautions, H&P , NPO status , Patient's Chart, lab work & pertinent test results, reviewed documented beta blocker date and time   Airway Mallampati: II  TM Distance: >3 FB Neck ROM: full    Dental no notable dental hx. (+) Teeth Intact   Pulmonary neg pulmonary ROS   Pulmonary exam normal breath sounds clear to auscultation       Cardiovascular Exercise Tolerance: Good hypertension, On Medications  Rhythm:regular Rate:Normal     Neuro/Psych  Headaches Recent facial droop and dx of Bells Palsy.  Pt with hx of low back pain that she attributes to previous epidural placement. Risks and benefits of repeat epidural discussed and accepted for placement. ja  Neuromuscular disease  negative psych ROS   GI/Hepatic negative GI ROS, Neg liver ROS,,,  Endo/Other  negative endocrine ROSdiabetes, Well Controlled    Renal/GU      Musculoskeletal   Abdominal   Peds  Hematology negative hematology ROS (+)   Anesthesia Other Findings   Reproductive/Obstetrics (+) Pregnancy                             Anesthesia Physical Anesthesia Plan  ASA: 2  Anesthesia Plan: Epidural   Post-op Pain Management:    Induction:   PONV Risk Score and Plan:   Airway Management Planned:   Additional Equipment:   Intra-op Plan:   Post-operative Plan:   Informed Consent: I have reviewed the patients History and Physical, chart, labs and discussed the procedure including the risks, benefits and alternatives for the proposed anesthesia with the patient or authorized representative who has indicated his/her understanding and acceptance.       Plan Discussed with:   Anesthesia Plan Comments:        Anesthesia Quick Evaluation

## 2023-01-25 NOTE — Anesthesia Procedure Notes (Signed)
Epidural Patient location during procedure: OB  Staffing Performed: anesthesiologist   Preanesthetic Checklist Completed: patient identified, IV checked, site marked, risks and benefits discussed, surgical consent, monitors and equipment checked, pre-op evaluation and timeout performed  Epidural Patient position: sitting Prep: Betadine Patient monitoring: heart rate, continuous pulse ox and blood pressure Approach: midline Location: L4-L5 Injection technique: LOR saline  Needle:  Needle type: Tuohy  Needle gauge: 17 G Needle length: 9 cm and 9 Needle insertion depth: 5 cm Catheter type: closed end flexible Catheter size: 19 Gauge Catheter at skin depth: 11 cm Test dose: negative and 1.5% lidocaine with Epi 1:200 K  Assessment Events: blood not aspirated, no cerebrospinal fluid, injection not painful, no injection resistance, no paresthesia and negative IV test  Additional Notes   Patient tolerated the insertion well without complications.Reason for block:procedure for pain

## 2023-01-26 LAB — RPR: RPR Ser Ql: NONREACTIVE

## 2023-01-26 LAB — CBC
HCT: 29.4 % — ABNORMAL LOW (ref 36.0–46.0)
Hemoglobin: 10.2 g/dL — ABNORMAL LOW (ref 12.0–15.0)
MCH: 29.8 pg (ref 26.0–34.0)
MCHC: 34.7 g/dL (ref 30.0–36.0)
MCV: 86 fL (ref 80.0–100.0)
Platelets: 232 10*3/uL (ref 150–400)
RBC: 3.42 MIL/uL — ABNORMAL LOW (ref 3.87–5.11)
RDW: 12.5 % (ref 11.5–15.5)
WBC: 13.4 10*3/uL — ABNORMAL HIGH (ref 4.0–10.5)
nRBC: 0 % (ref 0.0–0.2)

## 2023-01-26 MED ORDER — VARICELLA VIRUS VACCINE LIVE 1350 PFU/0.5ML IJ SUSR: 0.5000 mL | Freq: Once | INTRAMUSCULAR | Status: DC

## 2023-01-26 MED ORDER — COCONUT OIL OIL: 1.0000 | TOPICAL_OIL | Status: DC | PRN

## 2023-01-26 MED ORDER — IBUPROFEN 600 MG PO TABS
600.0000 mg | ORAL_TABLET | Freq: Four times a day (QID) | ORAL | Status: DC
  Administered 2023-01-26 – 2023-01-27 (×6): 600 mg via ORAL
  Filled 2023-01-26 (×6): qty 1

## 2023-01-26 MED ORDER — WITCH HAZEL-GLYCERIN EX PADS
1.0000 | MEDICATED_PAD | CUTANEOUS | Status: DC | PRN
  Filled 2023-01-26: qty 100

## 2023-01-26 MED ORDER — SIMETHICONE 80 MG PO CHEW: 80.0000 mg | CHEWABLE_TABLET | ORAL | Status: DC | PRN

## 2023-01-26 MED ORDER — DIBUCAINE (PERIANAL) 1 % EX OINT: 1.0000 | TOPICAL_OINTMENT | CUTANEOUS | Status: DC | PRN

## 2023-01-26 MED ORDER — BENZOCAINE-MENTHOL 20-0.5 % EX AERO
1.0000 | INHALATION_SPRAY | CUTANEOUS | Status: DC | PRN
  Filled 2023-01-26: qty 56

## 2023-01-26 MED ORDER — MEASLES, MUMPS & RUBELLA VAC IJ SOLR: 0.5000 mL | Freq: Once | INTRAMUSCULAR | Status: DC

## 2023-01-26 MED ORDER — ONDANSETRON HCL 4 MG PO TABS
4.0000 mg | ORAL_TABLET | ORAL | Status: DC | PRN
  Administered 2023-01-26: 4 mg via ORAL
  Filled 2023-01-26: qty 1

## 2023-01-26 MED ORDER — SENNOSIDES-DOCUSATE SODIUM 8.6-50 MG PO TABS
2.0000 | ORAL_TABLET | Freq: Every day | ORAL | Status: DC
  Administered 2023-01-27: 2 via ORAL
  Filled 2023-01-26: qty 2

## 2023-01-26 MED ORDER — ONDANSETRON HCL 4 MG/2ML IJ SOLN: 4.0000 mg | INTRAMUSCULAR | Status: DC | PRN

## 2023-01-26 MED ORDER — DIPHENHYDRAMINE HCL 25 MG PO CAPS: 25.0000 mg | ORAL_CAPSULE | Freq: Four times a day (QID) | ORAL | Status: DC | PRN

## 2023-01-26 MED ORDER — PRENATAL MULTIVITAMIN CH
1.0000 | ORAL_TABLET | Freq: Every day | ORAL | Status: DC
  Administered 2023-01-26 – 2023-01-27 (×2): 1 via ORAL
  Filled 2023-01-26 (×2): qty 1

## 2023-01-26 MED ORDER — ZOLPIDEM TARTRATE 5 MG PO TABS: 5.0000 mg | ORAL_TABLET | Freq: Every evening | ORAL | Status: DC | PRN

## 2023-01-26 MED ORDER — TETANUS-DIPHTH-ACELL PERTUSSIS 5-2.5-18.5 LF-MCG/0.5 IM SUSY: 0.5000 mL | PREFILLED_SYRINGE | Freq: Once | INTRAMUSCULAR | Status: DC

## 2023-01-26 MED ORDER — ACETAMINOPHEN 325 MG PO TABS
650.0000 mg | ORAL_TABLET | ORAL | Status: DC | PRN
  Administered 2023-01-26 – 2023-01-27 (×4): 650 mg via ORAL
  Filled 2023-01-26 (×4): qty 2

## 2023-01-26 NOTE — Anesthesia Postprocedure Evaluation (Signed)
Anesthesia Post Note  Patient: [employer]  Procedure(s) Performed: AN AD HOC LABOR EPIDURAL  Patient location during evaluation: Mother Baby Anesthesia Type: Epidural Level of consciousness: awake and alert Pain management: pain level controlled Vital Signs Assessment: post-procedure vital signs reviewed and stable Respiratory status: spontaneous breathing, nonlabored ventilation and respiratory function stable Cardiovascular status: stable Postop Assessment: no headache, no backache and epidural receding Anesthetic complications: no   No notable events documented.   Last Vitals:  Vitals:   01/26/23 0307 01/26/23 0408  BP:  129/84  Pulse: 77 65  Resp: 18 18  Temp: 36.9 C 36.6 C  SpO2: 98% 97%    Last Pain:  Vitals:   01/26/23 0330  TempSrc:   PainSc: 4                  Alioune Hodgkin

## 2023-01-26 NOTE — Progress Notes (Signed)
Post Partum Day 0  Subjective: Doing well, no concerns. Ambulating without difficulty, still working on managing pain with PO meds, tolerating regular diet, and voiding without difficulty.   No fever/chills, chest pain, shortness of breath, nausea/vomiting, or leg pain. No nipple or breast pain. No headache, visual changes, or RUQ/epigastric pain.  Objective: BP 111/71 (BP Location: Left Arm)   Pulse 67   Temp 98.7 F (37.1 C) (Oral)   Resp 18   Ht 5\' 3"  (1.6 m)   Wt 81.6 kg   SpO2 99%   BMI 31.89 kg/m    Physical Exam:  General: alert, cooperative, and mild distress Breasts: soft/nontender CV: RRR Pulm: nl effort Abdomen: soft, non-tender Uterine Fundus: firm Incision: n/a Perineum: intact Lochia: appropriate DVT Evaluation: No evidence of DVT seen on physical exam. [place]:      No data to display           Recent Labs    01/25/23 1709 01/26/23 0600  HGB 10.5* 10.2*  HCT 29.7* 29.4*  WBC 7.4 13.4*  PLT 265 232    Assessment/Plan: 27 y.o. G2P1001 postpartum day # 0  -Continue routine postpartum care -Immunization status: Needs Tdap, varicella, MMR, flu prior to discharge  Disposition: Continue inpatient postpartum care    LOS: 1 day   Raidon Swanner, CNM 01/26/2023, 8:43 AM

## 2023-01-26 NOTE — Discharge Summary (Signed)
Obstetrical Discharge Summary  Patient Name: Donna Reeves DOB: [birth year] MRN: [id number]  Date of Admission: 01/25/2023 Date of Delivery: 01/26/23 Delivered by: Lucrezia Europe, CNM Date of Discharge: 01/27/2023  Primary OB: [place]  LMP:No LMP recorded. EDC Estimated Date of Delivery: 01/26/23 Gestational Age at Delivery: [redacted]w[redacted]d   Antepartum complications:  1. Bell's palsy 2. Rubella and varicella non-immune 3. Vaginal varicosities Admitting Diagnosis: IOL for decreased fetal movement, elevated BP Secondary Diagnosis: NSVD Patient Active Problem List   Diagnosis Date Noted   Elevated blood pressure affecting pregnancy in third trimester, antepartum 01/25/2023   Decreased fetal movement 01/25/2023   Decreased fetal movement affecting management of pregnancy in third trimester 01/21/2023   Headache in pregnancy, antepartum, third trimester 01/16/2023   Encounter for supervision of normal pregnancy in third trimester 07/21/2022   History of herniated intervertebral disc 01/23/2021   Acute appendicitis 01/23/2021   Appendicitis 01/22/2021   Abdominal pain 01/22/2021   Nausea & vomiting 01/22/2021   Gestational hypertension 03/26/2017   Normal labor 03/25/2017   Low back pain 03/28/2013    Augmentation: AROM and Pitocin Complications: None Intrapartum complications/course: She arrived for evaluation of decreased fetal movement and elevated BP and decision made to proceed with induction of labor. Pitocin and AROM and she received an epidural and progressed to 10/100/+3 and pushed 4 minutes, delivering viable female infant over 1st degree laceration. Anterior shoulder initially stuck but McRoberts maneuver easily released anterior shoulder and delivery occurred without concern.  Date of Delivery: 01/26/23  Delivered By: Lucrezia Europe, CNM Delivery Type: spontaneous vaginal delivery Anesthesia: epidural Placenta: spontaneous Laceration: 1st degree, hemostatic and  approximated, no need for repair Episiotomy: none Newborn Data: Live born child "Gerardo" Birth Weight:  8lb 1.8oz APGAR: 9, 9  Newborn Delivery   Birth date/time: 01/26/2023 00:16:00 Delivery type: Vaginal, Spontaneous      Postpartum Procedures: None [place]:      No data to display            Post partum course:  Patient had an uncomplicated postpartum course.  By time of discharge on PPD#1, her pain was controlled on oral pain medications; she had appropriate lochia and was ambulating, voiding without difficulty and tolerating regular diet.  She was deemed stable for discharge to home.    Discharge Physical Exam:  BP 125/78 (BP Location: Left Arm)   Pulse 76   Temp 98.4 F (36.9 C) (Oral)   Resp 20   Ht 5\' 3"  (1.6 m)   Wt 81.6 kg   SpO2 98%   Breastfeeding Unknown   BMI 31.89 kg/m   General: NAD CV: RRR Pulm: CTABL, nl effort ABD: s/nd/nt, fundus firm and below the umbilicus Lochia: moderate Perineum: well approximated/intact DVT Evaluation: LE non-ttp, no evidence of DVT on exam.  Hemoglobin  Date Value Ref Range Status  01/27/2023 10.6 (L) 12.0 - 15.0 g/dL Final   HCT  Date Value Ref Range Status  01/27/2023 30.7 (L) 36.0 - 46.0 % Final     Disposition: stable, discharge to home. Baby Feeding: breastmilk Baby Disposition: home with mom  Rh Immune globulin given: n/a, A pos Rubella vaccine given: offered prior to discharge  Varicella vaccine given: offered prior to discharge  Tdap vaccine given in AP or PP setting: declined Flu vaccine given in AP or PP setting: declined  Contraception: declined  Prenatal Labs:  Blood type/Rh A pos  Antibody screen neg  Rubella Non-Immune  Varicella Non-Immune  RPR NR  HBsAg Neg  HIV NR  GC neg  Chlamydia neg  Genetic screening negative  1 hour GTT 101  3 hour GTT    GBS negative     Plan:  Keshona Giovannetti was discharged to home in good condition. Follow-up appointment with delivering  provider in 6 weeks.  Discharge Medications: Allergies as of 01/27/2023       Reactions   Ultram [tramadol] Nausea Only, Other (See Comments)   headache   Augmentin [amoxicillin-pot Clavulanate] Other (See Comments)   Unknown.   Cipro [ciprofloxacin Hcl] Swelling, Rash   Arm started "getting red and swollen" 01/23/21        Medication List     STOP taking these medications    ondansetron 4 MG tablet Commonly known as: ZOFRAN       TAKE these medications    acetaminophen 325 MG tablet Commonly known as: Tylenol Take 2 tablets (650 mg total) by mouth every 4 (four) hours as needed (for pain scale < 4).   ibuprofen 600 MG tablet Commonly known as: ADVIL Take 1 tablet (600 mg total) by mouth every 6 (six) hours as needed for mild pain or cramping.   prenatal multivitamin Tabs tablet Take 1 tablet by mouth daily at 12 noon.         Follow-up Information     Gertie Fey, CNM. Schedule an appointment as soon as possible for a visit in 6 week(s).   Specialty: Certified Nurse Midwife Why: postpartum visit Contact information: [address] [place] [id number] [phone]                 Signed:  Terance Ice 01/27/2023 9:01 AM  Drinda Butts, CNM Certified Nurse Midwife [facility]

## 2023-01-27 ENCOUNTER — Encounter: Payer: Self-pay | Admitting: Obstetrics and Gynecology

## 2023-01-27 LAB — CBC
HCT: 30.7 % — ABNORMAL LOW (ref 36.0–46.0)
Hemoglobin: 10.6 g/dL — ABNORMAL LOW (ref 12.0–15.0)
MCH: 29.7 pg (ref 26.0–34.0)
MCHC: 34.5 g/dL (ref 30.0–36.0)
MCV: 86 fL (ref 80.0–100.0)
Platelets: 252 10*3/uL (ref 150–400)
RBC: 3.57 MIL/uL — ABNORMAL LOW (ref 3.87–5.11)
RDW: 12.8 % (ref 11.5–15.5)
WBC: 8.7 10*3/uL (ref 4.0–10.5)
nRBC: 0 % (ref 0.0–0.2)

## 2023-01-27 MED ORDER — IBUPROFEN 600 MG PO TABS: 600.0000 mg | ORAL_TABLET | Freq: Four times a day (QID) | ORAL | 0 refills | Status: DC | PRN

## 2023-01-27 MED ORDER — ACETAMINOPHEN 325 MG PO TABS: 650.0000 mg | ORAL_TABLET | ORAL | Status: AC | PRN

## 2023-01-27 MED ORDER — PRENATAL MULTIVITAMIN CH: 1.0000 | ORAL_TABLET | Freq: Every day | ORAL | Status: AC

## 2023-01-27 NOTE — Discharge Instructions (Signed)
Vaginal Delivery, Care After Refer to this sheet in the next few weeks. These discharge instructions provide you with information on caring for yourself after delivery. Your caregiver may also give you specific instructions. Your treatment has been planned according to the most current medical practices available, but problems sometimes occur. Call your caregiver if you have any problems or questions after you go home. HOME CARE INSTRUCTIONS Take over-the-counter or prescription medicines only as directed by your caregiver or pharmacist. Do not drink alcohol, especially if you are breastfeeding or taking medicine to relieve pain. Do not smoke tobacco. Continue to use good perineal care. Good perineal care includes: Wiping your perineum from back to front Keeping your perineum clean. You can do sitz baths twice a day, to help keep this area clean Do not use tampons, douche or have sex until your caregiver says it is okay. Shower only and avoid sitting in submerged water, aside from sitz baths Wear a well-fitting bra that provides breast support. Eat healthy foods. Drink enough fluids to keep your urine clear or pale yellow. Eat high-fiber foods such as whole grain cereals and breads, brown rice, beans, and fresh fruits and vegetables every day. These foods may help prevent or relieve constipation. Avoid constipation with high fiber foods or medications, such as miralax or metamucil Follow your caregiver's recommendations regarding resumption of activities such as climbing stairs, driving, lifting, exercising, or traveling. Talk to your caregiver about resuming sexual activities. Resumption of sexual activities is dependent upon your risk of infection, your rate of healing, and your comfort and desire to resume sexual activity. Try to have someone help you with your household activities and your newborn for at least a few days after you leave the hospital. Rest as much as possible. Try to rest or  take a nap when your newborn is sleeping. Increase your activities gradually. Keep all of your scheduled postpartum appointments. It is very important to keep your scheduled follow-up appointments. At these appointments, your caregiver will be checking to make sure that you are healing physically and emotionally. SEEK MEDICAL CARE IF:  You are passing large clots from your vagina. Save any clots to show your caregiver. You have a foul smelling discharge from your vagina. You have trouble urinating. You are urinating frequently. You have pain when you urinate. You have a change in your bowel movements. You have increasing redness, pain, or swelling near your vaginal incision (episiotomy) or vaginal tear. You have pus draining from your episiotomy or vaginal tear. Your episiotomy or vaginal tear is separating. You have painful, hard, or reddened breasts. You have a severe headache. You have blurred vision or see spots. You feel sad or depressed. You have thoughts of hurting yourself or your newborn. You have questions about your care, the care of your newborn, or medicines. You are dizzy or light-headed. You have a rash. You have nausea or vomiting. You were breastfeeding and have not had a menstrual period within 12 weeks after you stopped breastfeeding. You are not breastfeeding and have not had a menstrual period by the 12th week after delivery. You have a fever. SEEK IMMEDIATE MEDICAL CARE IF:  You have persistent pain. You have chest pain. You have shortness of breath. You faint. You have leg pain. You have stomach pain. Your vaginal bleeding saturates two or more sanitary pads in 1 hour. MAKE SURE YOU:  Understand these instructions. Will watch your condition. Will get help right away if you are not doing well or   get worse. Document Released: 12/11/2000 Document Revised: 04/30/2014 Document Reviewed: 08/10/2012 ExitCare Patient Information 2015 [employer]. This  information is not intended to replace advice given to you by your health care provider. Make sure you discuss any questions you have with your health care provider.  Sitz Bath A sitz bath is a warm water bath taken in the sitting position. The water covers only the hips and butt (buttocks). We recommend using one that fits in the toilet, to help with ease of use and cleanliness. It may be used for either healing or cleaning purposes. Sitz baths are also used to relieve pain, itching, or muscle tightening (spasms). The water may contain medicine. Moist heat will help you heal and relax.  HOME CARE  Take 3 to 4 sitz baths a day. Fill the bathtub half-full with warm water. Sit in the water and open the drain a little. Turn on the warm water to keep the tub half-full. Keep the water running constantly. Soak in the water for 15 to 20 minutes. After the sitz bath, pat the affected area dry. GET HELP RIGHT AWAY IF: You get worse instead of better. Stop the sitz baths if you get worse. MAKE SURE YOU: Understand these instructions. Will watch your condition. Will get help right away if you are not doing well or get worse. Document Released: 01/21/2005 Document Revised: 09/07/2012 Document Reviewed: 04/13/2011 ExitCare Patient Information 2015 [employer]. This information is not intended to replace advice given to you by your health care provider. Make sure you discuss any questions you have with your health care provider.   

## 2023-01-27 NOTE — Lactation Note (Signed)
This note was copied from a baby's chart. Lactation Consultation Note  Patient Name: Donna Reeves [id number] Date: 01/27/2023 Reason for consult: Follow-up assessment;Term Age:26 hours  Maternal Data Has patient been taught Hand Expression?: Yes Does the patient have breastfeeding experience prior to this delivery?: Yes How long did the patient breastfeed?: 1.5 years  Mom and baby preparing for discharge. Mom feeling well, confident with BF.  Feeding Mother's Current Feeding Choice: Breast Milk  Baby feeding well, void and stools exceed minimum expectations, passed 24hr screens and bilirubin WNL.  Baby active at breast upon entry; strong rhythmic suck pattern visible, audible swallows.   LATCH Score Latch:  (latch appeared well, controlled and deep, strong jaw movement)   Lactation Tools Discussed/Used    Mom provided with manual pump per her request before discharge. Mom reports being well versed in how to use the pump (used one with her last child).  Interventions Interventions: Breast feeding basics reviewed;Hand express;Pre-pump if needed;Hand pump;Education  Reviewed 8 or more feedings per 24 hours, output expectations, cluster feeding and growth spurts.   Discharge Discharge Education: Engorgement and breast care;Warning signs for feeding baby;Outpatient recommendation Pump: Manual (provided manual pump)  Management of breast changes; frequent emptying, difference before/after feedings, nipple care.  Consult Status Consult Status: Complete  Outpatient lactation number provided; encouraged to call with questions/concerns and ongoing BF support if needed.  Lavonia Drafts 01/27/2023, 10:15 AM

## 2023-08-23 DIAGNOSIS — Z3490 Encounter for supervision of normal pregnancy, unspecified, unspecified trimester: Secondary | ICD-10-CM | POA: Insufficient documentation

## 2023-09-02 LAB — OB RESULTS CONSOLE RUBELLA ANTIBODY, IGM: Rubella: NON-IMMUNE/NOT IMMUNE

## 2023-09-02 LAB — OB RESULTS CONSOLE HEPATITIS B SURFACE ANTIGEN: Hepatitis B Surface Ag: NEGATIVE

## 2023-09-02 LAB — OB RESULTS CONSOLE VARICELLA ZOSTER ANTIBODY, IGG: Varicella: NON-IMMUNE/NOT IMMUNE

## 2023-12-29 ENCOUNTER — Encounter: Payer: Self-pay | Admitting: Obstetrics and Gynecology

## 2023-12-29 ENCOUNTER — Observation Stay
Admission: EM | Admit: 2023-12-29 | Discharge: 2023-12-30 | Disposition: A | Discharge status: Home / Self Care | Payer: Medicaid Other | Attending: Obstetrics and Gynecology | Admitting: Obstetrics and Gynecology

## 2023-12-29 ENCOUNTER — Other Ambulatory Visit: Payer: Self-pay

## 2023-12-29 DIAGNOSIS — R112 Nausea with vomiting, unspecified: Principal | ICD-10-CM | POA: Diagnosis present

## 2023-12-29 DIAGNOSIS — Z87891 Personal history of nicotine dependence: Secondary | ICD-10-CM | POA: Diagnosis not present

## 2023-12-29 DIAGNOSIS — O219 Vomiting of pregnancy, unspecified: Secondary | ICD-10-CM | POA: Diagnosis present

## 2023-12-29 DIAGNOSIS — Z3A28 28 weeks gestation of pregnancy: Secondary | ICD-10-CM | POA: Diagnosis not present

## 2023-12-29 DIAGNOSIS — Z1152 Encounter for screening for COVID-19: Secondary | ICD-10-CM | POA: Diagnosis not present

## 2023-12-29 LAB — URINALYSIS, COMPLETE (UACMP) WITH MICROSCOPIC
Bilirubin Urine: NEGATIVE
Glucose, UA: NEGATIVE mg/dL
Ketones, ur: 80 mg/dL — AB
Leukocytes,Ua: NEGATIVE
Nitrite: NEGATIVE
Protein, ur: 30 mg/dL — AB
Specific Gravity, Urine: 1.025 (ref 1.005–1.030)
pH: 5 (ref 5.0–8.0)

## 2023-12-29 LAB — COMPREHENSIVE METABOLIC PANEL
ALT: 16 U/L (ref 0–44)
AST: 15 U/L (ref 15–41)
Albumin: 3.1 g/dL — ABNORMAL LOW (ref 3.5–5.0)
Alkaline Phosphatase: 105 U/L (ref 38–126)
Anion gap: 13 (ref 5–15)
BUN: 9 mg/dL (ref 6–20)
CO2: 19 mmol/L — ABNORMAL LOW (ref 22–32)
Calcium: 8.8 mg/dL — ABNORMAL LOW (ref 8.9–10.3)
Chloride: 103 mmol/L (ref 98–111)
Creatinine, Ser: 0.49 mg/dL (ref 0.44–1.00)
GFR, Estimated: >60 mL/min (ref >60)
Glucose, Bld: 86 mg/dL (ref 70–99)
Potassium: 3.9 mmol/L (ref 3.5–5.1)
Sodium: 135 mmol/L (ref 135–145)
Total Bilirubin: 1.1 mg/dL (ref 0.0–1.2)
Total Protein: 7 g/dL (ref 6.5–8.1)

## 2023-12-29 LAB — CBC
HCT: 33.1 % — ABNORMAL LOW (ref 36.0–46.0)
Hemoglobin: 11.6 g/dL — ABNORMAL LOW (ref 12.0–15.0)
MCH: 30.8 pg (ref 26.0–34.0)
MCHC: 35 g/dL (ref 30.0–36.0)
MCV: 87.8 fL (ref 80.0–100.0)
Platelets: 266 10*3/uL (ref 150–400)
RBC: 3.77 MIL/uL — ABNORMAL LOW (ref 3.87–5.11)
RDW: 12.8 % (ref 11.5–15.5)
WBC: 15.8 10*3/uL — ABNORMAL HIGH (ref 4.0–10.5)
nRBC: 0 % (ref 0.0–0.2)

## 2023-12-29 LAB — RESP PANEL BY RT-PCR (RSV, FLU A&B, COVID)  RVPGX2
Influenza A by PCR: NEGATIVE
Influenza B by PCR: NEGATIVE
Resp Syncytial Virus by PCR: NEGATIVE
SARS Coronavirus 2 by RT PCR: NEGATIVE

## 2023-12-29 MED ORDER — LACTATED RINGERS IV BOLUS
1000.0000 mL | Freq: Once | INTRAVENOUS | Status: AC
  Administered 2023-12-29: 1000 mL via INTRAVENOUS

## 2023-12-29 MED ORDER — ONDANSETRON 4 MG PO TBDP: 4.0000 mg | ORAL_TABLET | Freq: Three times a day (TID) | ORAL | 0 refills | Status: DC | PRN

## 2023-12-29 MED ORDER — PROMETHAZINE HCL 25 MG RE SUPP
12.5000 mg | Freq: Once | RECTAL | Status: AC
  Administered 2023-12-29: 12.5 mg via RECTAL
  Filled 2023-12-29: qty 1

## 2023-12-29 MED ORDER — ONDANSETRON 4 MG PO TBDP
8.0000 mg | ORAL_TABLET | Freq: Once | ORAL | Status: AC
  Administered 2023-12-29: 8 mg via ORAL
  Filled 2023-12-29: qty 2

## 2023-12-29 MED ORDER — ACETAMINOPHEN 325 MG PO TABS: 650.0000 mg | ORAL_TABLET | ORAL | Status: DC | PRN

## 2023-12-29 NOTE — Discharge Summary (Signed)
 Patient ID: Donna Reeves MRN: [id number] DOB/AGE: 1996-08-24 27 y.o.  Admit date: 12/29/2023 Discharge date: 12/29/2023  Admission Diagnoses: 27yo G3P2 at [redacted]w[redacted]d presents with N/V/D since last night.  She reports everyone in the household has the same symptoms.  Discharge Diagnoses: Viral GI symptoms  Factors complicating pregnancy: Bell's palsy last pregnancy Rubella and varicella NON-immune Closely spaced pregnancies, delivered January 2024 Abnormal pap smear  Prenatal Procedures: none  Consults: None  Significant Diagnostic Studies:  Results for orders placed or performed during the hospital encounter of 12/29/23 (from the past week)  CBC   Collection Time: 12/29/23  8:31 PM  Result Value Ref Range   WBC 15.8 (H) 4.0 - 10.5 K/uL   RBC 3.77 (L) 3.87 - 5.11 MIL/uL   Hemoglobin 11.6 (L) 12.0 - 15.0 g/dL   HCT 66.8 (L) 63.9 - 53.9 %   MCV 87.8 80.0 - 100.0 fL   MCH 30.8 26.0 - 34.0 pg   MCHC 35.0 30.0 - 36.0 g/dL   RDW 87.1 88.4 - 84.4 %   Platelets 266 150 - 400 K/uL   nRBC 0.0 0.0 - 0.2 %  Comprehensive metabolic panel   Collection Time: 12/29/23  8:31 PM  Result Value Ref Range   Sodium 135 135 - 145 mmol/L   Potassium 3.9 3.5 - 5.1 mmol/L   Chloride 103 98 - 111 mmol/L   CO2 19 (L) 22 - 32 mmol/L   Glucose, Bld 86 70 - 99 mg/dL   BUN 9 6 - 20 mg/dL   Creatinine, Ser 9.50 0.44 - 1.00 mg/dL   Calcium  8.8 (L) 8.9 - 10.3 mg/dL   Total Protein 7.0 6.5 - 8.1 g/dL   Albumin 3.1 (L) 3.5 - 5.0 g/dL   AST 15 15 - 41 U/L   ALT 16 0 - 44 U/L   Alkaline Phosphatase 105 38 - 126 U/L   Total Bilirubin 1.1 0.0 - 1.2 mg/dL   GFR, Estimated >39 >39 mL/min   Anion gap 13 5 - 15  Resp panel by RT-PCR (RSV, Flu A&B, Covid) Anterior Nasal Swab   Collection Time: 12/29/23  8:51 PM   Specimen: Anterior Nasal Swab  Result Value Ref Range   SARS Coronavirus 2 by RT PCR NEGATIVE NEGATIVE   Influenza A by PCR NEGATIVE NEGATIVE   Influenza B by PCR NEGATIVE NEGATIVE   Resp  Syncytial Virus by PCR NEGATIVE NEGATIVE  Urinalysis, Complete w Microscopic -Urine, Clean Catch   Collection Time: 12/29/23  8:51 PM  Result Value Ref Range   Color, Urine AMBER (A) YELLOW   APPearance HAZY (A) CLEAR   Specific Gravity, Urine 1.025 1.005 - 1.030   pH 5.0 5.0 - 8.0   Glucose, UA NEGATIVE NEGATIVE mg/dL   Hgb urine dipstick SMALL (A) NEGATIVE   Bilirubin Urine NEGATIVE NEGATIVE   Ketones, ur 80 (A) NEGATIVE mg/dL   Protein, ur 30 (A) NEGATIVE mg/dL   Nitrite NEGATIVE NEGATIVE   Leukocytes,Ua NEGATIVE NEGATIVE   RBC / HPF 6-10 0 - 5 RBC/hpf   WBC, UA 0-5 0 - 5 WBC/hpf   Bacteria, UA RARE (A) NONE SEEN   Squamous Epithelial / HPF 6-10 0 - 5 /HPF   Mucus PRESENT     Treatments: IV hydration and Phenergan    Hospital Course:  This is a 28 y.o. [id number] with IUP at [redacted]w[redacted]d admitted for new onset n/v/d.  No UC, LOF or VB.  IVF bolus and labs were collected and lab results are noted  above    She was observed, pt's symptoms improved with Phenergan  and Zofran , fetal heart rate monitoring remained reassuring, and she had no signs/symptoms of labor or other maternal-fetal concerns. She was deemed stable for discharge to home with outpatient follow up.  Discharge Physical Exam:  BP 123/77 (BP Location: Left Arm)   Pulse 85   Temp 98.2 F (36.8 C) (Oral)   Resp 17   Ht 5' 3 (1.6 m)   Wt 78.9 kg   LMP 06/11/2023   BMI 30.82 kg/m   General: NAD CV: RRR Pulm: CTABL, nl effort ABD: s/nd/nt, gravid DVT Evaluation: LE non-ttp, no evidence of DVT on exam.   FHT appropriate for GA TOCO: quiet SVE: deferred      Discharge Condition: Stable  Disposition: Discharge disposition: 01-Home or Self Care        Allergies as of 12/29/2023       Reactions   Ultram [tramadol] Nausea Only, Other (See Comments)   headache   Augmentin  [amoxicillin -pot Clavulanate] Other (See Comments)   Unknown.   Cipro  [ciprofloxacin  Hcl] Swelling, Rash   Arm started getting red and  swollen 01/23/21        Medication List     STOP taking these medications    ibuprofen  600 MG tablet Commonly known as: ADVIL        TAKE these medications    acetaminophen  325 MG tablet Commonly known as: Tylenol  Take 2 tablets (650 mg total) by mouth every 4 (four) hours as needed (for pain scale < 4).   ondansetron  4 MG disintegrating tablet Commonly known as: ZOFRAN -ODT Take 1 tablet (4 mg total) by mouth every 8 (eight) hours as needed for nausea or vomiting.   prenatal multivitamin Tabs tablet Take 1 tablet by mouth daily at 12 noon.        Follow-up Information     [facility] Follow up.   Why: Keep all scheduled appointments Contact information: [address] [place]  [address] [phone]                Signed:  DELON COE, CNM 12/29/2023 11:48 PM

## 2023-12-29 NOTE — OB Triage Note (Signed)
 Pt states that she felt sick around 2200 or 2300 last night and has had N/V/D. Pt denies LOF or bleeding at this time.

## 2023-12-29 NOTE — L&D Delivery Note (Signed)
 Delivery Note  First Stage: Labor onset: 1700 Augmentation : AROM Analgesia /Anesthesia intrapartum: epidural AROM at 2126  Second Stage: Complete dilation at 2254 Onset of pushing at 2300 FHR second stage n/a, delivery imminent  Delivery of a viable female infant 03/14/2024 at 2301 by Donato Schultz, CNM. delivery of fetal head in OP position with restitution to LOP. No nuchal cord;  Anterior then posterior shoulders delivered easily with gentle downward traction. Baby placed on mom's chest, and attended to by peds.  Cord double clamped after cessation of pulsation, cut by FOB  Third Stage: Placenta delivered Tomasa Blase intact with 3 VC @ 2313 Placenta disposition: parents will take it home to bury it Uterine tone firm / bleeding scant  Small left periurethral abrasion identified  Anesthesia for repair: none needed Repair none needed Est. Blood Loss (mL):  Complications: none  Mom to postpartum.  Baby to Couplet care / Skin to Skin.  Newborn: Birth Weight: TBD, infant skin-to-skin  Apgar Scores: 9, 9 Feeding planned: breastfeeding

## 2023-12-29 NOTE — ED Triage Notes (Signed)
 Pt states she has been vomiting and hasn't been able to keep anything down. Pt states she isn't able to get any nutrients to the baby.   3rd floor called and spoke with RN Charge Tresa Endo and she states pt to be evaluated in ED first.

## 2023-12-31 LAB — OB RESULTS CONSOLE RPR: RPR: NONREACTIVE

## 2023-12-31 LAB — OB RESULTS CONSOLE HIV ANTIBODY (ROUTINE TESTING): HIV: NONREACTIVE

## 2024-02-18 ENCOUNTER — Observation Stay: Payer: Medicaid Other

## 2024-02-18 ENCOUNTER — Observation Stay
Admission: EM | Admit: 2024-02-18 | Discharge: 2024-02-18 | Disposition: A | Discharge status: Home / Self Care | Payer: Medicaid Other | Attending: Certified Nurse Midwife | Admitting: Certified Nurse Midwife

## 2024-02-18 ENCOUNTER — Encounter: Payer: Self-pay | Admitting: Obstetrics and Gynecology

## 2024-02-18 ENCOUNTER — Other Ambulatory Visit: Payer: Self-pay

## 2024-02-18 DIAGNOSIS — Z79899 Other long term (current) drug therapy: Secondary | ICD-10-CM | POA: Diagnosis not present

## 2024-02-18 DIAGNOSIS — O26893 Other specified pregnancy related conditions, third trimester: Secondary | ICD-10-CM | POA: Insufficient documentation

## 2024-02-18 DIAGNOSIS — O368130 Decreased fetal movements, third trimester, not applicable or unspecified: Secondary | ICD-10-CM | POA: Diagnosis present

## 2024-02-18 DIAGNOSIS — R1030 Lower abdominal pain, unspecified: Secondary | ICD-10-CM | POA: Insufficient documentation

## 2024-02-18 DIAGNOSIS — Z3A36 36 weeks gestation of pregnancy: Secondary | ICD-10-CM | POA: Diagnosis not present

## 2024-02-18 DIAGNOSIS — O368190 Decreased fetal movements, unspecified trimester, not applicable or unspecified: Secondary | ICD-10-CM | POA: Diagnosis present

## 2024-02-18 LAB — URINALYSIS, COMPLETE (UACMP) WITH MICROSCOPIC
Bilirubin Urine: NEGATIVE
Glucose, UA: NEGATIVE mg/dL
Hgb urine dipstick: NEGATIVE
Ketones, ur: NEGATIVE mg/dL
Leukocytes,Ua: NEGATIVE
Nitrite: NEGATIVE
Protein, ur: 30 mg/dL — AB
Specific Gravity, Urine: 1.024 (ref 1.005–1.030)
pH: 6 (ref 5.0–8.0)

## 2024-02-18 LAB — WET PREP, GENITAL
Clue Cells Wet Prep HPF POC: NONE SEEN
Sperm: NONE SEEN
Trich, Wet Prep: NONE SEEN
WBC, Wet Prep HPF POC: <10 (ref <10)
Yeast Wet Prep HPF POC: NONE SEEN

## 2024-02-18 MED ORDER — ACETAMINOPHEN 500 MG PO TABS
1000.0000 mg | ORAL_TABLET | Freq: Once | ORAL | Status: DC
  Filled 2024-02-18: qty 2

## 2024-02-18 MED ORDER — LACTATED RINGERS IV BOLUS
1000.0000 mL | Freq: Once | INTRAVENOUS | Status: AC
  Administered 2024-02-18: 1000 mL via INTRAVENOUS

## 2024-02-18 NOTE — Progress Notes (Signed)
 Patient transported to [place] via wheelchair

## 2024-02-18 NOTE — Discharge Instructions (Signed)
For skin rashes try the following topicals: - Aveeno, Benadryl cream, 1% hydrocortisone cream, A & D ointment, Lanolin, Neosporin, or Aloe.   If symptoms worsen, notify OB provider or seek care at the emergency department.

## 2024-02-18 NOTE — OB Triage Note (Signed)
Patient is a 28 yo, G3P2, at 36 weeks 0 days. Patient presents with complaints of abdominal pain and decreased fetal movement. Patient states she has been having a cramping pain in her lower abdomen that was intense yesterday but it has improved today. Patient states her baby has not been moving as much as normal for the last three days but she was having a hard time getting to the hospital due to the adverse weather. Patient denies any vaginal bleeding or LOF. Patient denies any regular or consistent contractions. Monitors applied and assessing. VSS. Initial fetal heart tone 135. [place] CNM notified of patients arrival to unit. Plan to place in observation for fetal monitoring and PO hydration.

## 2024-02-18 NOTE — Discharge Summary (Signed)
Donna Reeves is a 28 y.o. female. She is at [redacted]w[redacted]d gestation. Patient's last menstrual period was 06/11/2023. Estimated Date of Delivery: 03/17/24  Prenatal care site: [facility]  Chief complaint: Decreased Fetal Movement and lower abdominal pain  HPI: Donna Reeves presents to L&D with concerns of decreased fetal movement and lower abdominal pain. Reports intermittent cramping in lower abdomen which started yesterday. She states it was more intense yesterday but has improved today (02/18/2024). She reports experiencing decreased fetal movement since Wednesday, but has has transportation issues arriving to the hospital due to the recent inclement weather. Denies vaginal bleeding, contractions, and leakage of fluid.   Factors complicating pregnancy: H/o Bell's palsy in pregnancy Rubella non immune Varicella non immune Close interval pregnancy- last pregnancy 01/26/2023 Anemia  S: Resting comfortably. No contractions, no vaginal bleeding, and no leakage of fluid.   Maternal Medical History:  Past Medical Hx:  has a past medical history of Bell's palsy, Degenerative disc disease, Herniated disc, and Sciatica.    Past Surgical Hx:  has a past surgical history that includes laparoscopic appendectomy (N/A, 01/23/2021).   Allergies  Allergen Reactions   Ultram [Tramadol] Nausea Only and Other (See Comments)    headache   Augmentin [Amoxicillin-Pot Clavulanate] Other (See Comments)    Unknown.   Cipro [Ciprofloxacin Hcl] Swelling and Rash    Arm started "getting red and swollen" 01/23/21     Prior to Admission medications   Medication Sig Start Date End Date Taking? Authorizing Provider  Prenatal Vit-Fe Fumarate-FA (PRENATAL MULTIVITAMIN) TABS tablet Take 1 tablet by mouth daily at 12 noon. 01/27/23  Yes Gustavo Lah, CNM  acetaminophen (TYLENOL) 325 MG tablet Take 2 tablets (650 mg total) by mouth every 4 (four) hours as needed (for pain scale < 4). 01/27/23   Gustavo Lah, CNM  ondansetron (ZOFRAN-ODT) 4 MG disintegrating tablet Take 1 tablet (4 mg total) by mouth every 8 (eight) hours as needed for nausea or vomiting. 12/29/23   Haroldine Laws, CNM    Social History: She  reports that she has never smoked. She has never used smokeless tobacco. She reports that she does not drink alcohol and does not use drugs.  Family History: family history is not on file. No history of gyn cancers  Review of Systems:  Review of Systems  Constitutional: Negative.   Eyes: Negative.   Respiratory: Negative.    Cardiovascular: Negative.   Gastrointestinal:  Positive for abdominal pain (intermittent lower abdominal cramping).  Genitourinary: Negative.   Neurological: Negative.   Psychiatric/Behavioral: Negative.     O:  BP 116/74   Pulse 88   Temp 98 F (36.7 C) (Oral)   Resp 16   Ht 5\' 3"  (1.6 m)   Wt 78.9 kg   LMP 06/11/2023   BMI 30.82 kg/m  Results for orders placed or performed during the hospital encounter of 02/18/24 (from the past 48 hours)  Wet prep, genital   Collection Time: 02/18/24  6:34 PM   Specimen: Vaginal  Result Value Ref Range   Yeast Wet Prep HPF POC NONE SEEN NONE SEEN   Trich, Wet Prep NONE SEEN NONE SEEN   Clue Cells Wet Prep HPF POC NONE SEEN NONE SEEN   WBC, Wet Prep HPF POC <10 <10   Sperm NONE SEEN   Urinalysis, Complete w Microscopic -Urine, Clean Catch   Collection Time: 02/18/24  6:34 PM  Result Value Ref Range   Color, Urine AMBER (A) YELLOW  APPearance HAZY (A) CLEAR   Specific Gravity, Urine 1.024 1.005 - 1.030   pH 6.0 5.0 - 8.0   Glucose, UA NEGATIVE NEGATIVE mg/dL   Hgb urine dipstick NEGATIVE NEGATIVE   Bilirubin Urine NEGATIVE NEGATIVE   Ketones, ur NEGATIVE NEGATIVE mg/dL   Protein, ur 30 (A) NEGATIVE mg/dL   Nitrite NEGATIVE NEGATIVE   Leukocytes,Ua NEGATIVE NEGATIVE   RBC / HPF 0-5 0 - 5 RBC/hpf   WBC, UA 0-5 0 - 5 WBC/hpf   Bacteria, UA RARE (A) NONE SEEN   Squamous Epithelial / HPF 11-20 0 - 5 /HPF    Mucus PRESENT    Ca Oxalate Crys, UA PRESENT     Constitutional: NAD, AAOx3  HE/ENT: extraocular movements grossly intact, moist mucous membranes CV: RRR PULM: nl respiratory effort, CTABL Abd: gravid, non-tender, non-distended, soft  Ext: Non-tender, Nonedmeatous Psych: mood appropriate, speech normal Pelvic : Deferred   NST:  Baseline: 110 bpm Variability: moderate Accels: Present Decels: none Toco: occasional x 1 Category: I Interpretation:  INDICATIONS: decreased fetal movement RESULTS:  A NST procedure was performed with FHR monitoring and a normal baseline established, appropriate time of 20-40 minutes of evaluation, and accels >2 seen w 15x15 characteristics.  Results show a REACTIVE NST.   Assessment: 28 y.o. [redacted]w[redacted]d here for antenatal surveillance during pregnancy.  Principle diagnosis: Decreased Fetal movement and lower abdominal pain  Plan: Decreased Fetal Movement  Labor: Not present. [place] and toco placed on abdomen. FHR tracing monitored for approximately 60 minutes.Toco occasional contraction x 1. Toco showed uterine irritability. Patient denies feeling contractions.  US OB Limited ordered on 02/18/2024; Results: WNL       - AFI: 14.5 (5%tile = 7.7 cm, 95% = 29.9 cm for 35 wks)       - Presentation: Cephalic       - Placenta: Posterior BPP ordered on 02/18/2024; Results:  WNL - 8/8 Fetal Wellbeing: Reassuring Cat 1 tracing. Reactive NST  Education on fetal kick counts provided to patient in AVS.   2. Abdominal pain, lower  Wet prep and UA collected today (02/18/2024) Results as follows:  - Wet prep: Negative  - Urinalysis: - Color, urine: Amber - Appearance: Hazy - Protein: 30 - LR Bolus 1,000 mL given  Tylenol 1,000 mg ordered to be given. Patient declined. Report pain 0/0. Per patient, she is not having abdominal pain currently.  Encourage PO fluid intake in L&D triage.  Education on abdominal pain and methods of relief in pregnancy provided to patient in  AVS.   - Plan of care reviewed with D.V. Schermerhorn, MD.   - D/c home stable, strict return precautions reviewed, follow-up as needed.  ----- Roney Jaffe, CNM Certified Nurse Midwife [facility]

## 2024-02-21 LAB — OB RESULTS CONSOLE GC/CHLAMYDIA
Chlamydia: NEGATIVE
Neisseria Gonorrhea: NEGATIVE

## 2024-02-21 LAB — OB RESULTS CONSOLE GBS: GBS: NEGATIVE

## 2024-03-14 ENCOUNTER — Inpatient Hospital Stay
Admission: EM | Admit: 2024-03-14 | Discharge: 2024-03-16 | DRG: 807 | Disposition: A | Discharge status: Home / Self Care | Attending: Obstetrics and Gynecology | Admitting: Obstetrics and Gynecology

## 2024-03-14 ENCOUNTER — Encounter: Payer: Self-pay | Admitting: Obstetrics and Gynecology

## 2024-03-14 ENCOUNTER — Other Ambulatory Visit: Payer: Self-pay

## 2024-03-14 ENCOUNTER — Inpatient Hospital Stay: Admitting: Anesthesiology

## 2024-03-14 DIAGNOSIS — Z886 Allergy status to analgesic agent status: Secondary | ICD-10-CM | POA: Diagnosis not present

## 2024-03-14 DIAGNOSIS — O99019 Anemia complicating pregnancy, unspecified trimester: Secondary | ICD-10-CM

## 2024-03-14 DIAGNOSIS — Z881 Allergy status to other antibiotic agents status: Secondary | ICD-10-CM

## 2024-03-14 DIAGNOSIS — O99892 Other underimmunization status: Secondary | ICD-10-CM

## 2024-03-14 DIAGNOSIS — O26893 Other specified pregnancy related conditions, third trimester: Secondary | ICD-10-CM | POA: Diagnosis present

## 2024-03-14 DIAGNOSIS — R7309 Other abnormal glucose: Secondary | ICD-10-CM

## 2024-03-14 DIAGNOSIS — O09899 Supervision of other high risk pregnancies, unspecified trimester: Secondary | ICD-10-CM

## 2024-03-14 DIAGNOSIS — O9902 Anemia complicating childbirth: Principal | ICD-10-CM | POA: Diagnosis present

## 2024-03-14 DIAGNOSIS — Z8669 Personal history of other diseases of the nervous system and sense organs: Secondary | ICD-10-CM

## 2024-03-14 DIAGNOSIS — Z2839 Other underimmunization status: Secondary | ICD-10-CM

## 2024-03-14 DIAGNOSIS — Z3A39 39 weeks gestation of pregnancy: Secondary | ICD-10-CM

## 2024-03-14 DIAGNOSIS — O479 False labor, unspecified: Secondary | ICD-10-CM | POA: Diagnosis present

## 2024-03-14 LAB — CBC
HCT: 29.3 % — ABNORMAL LOW (ref 36.0–46.0)
Hemoglobin: 10.2 g/dL — ABNORMAL LOW (ref 12.0–15.0)
MCH: 30.1 pg (ref 26.0–34.0)
MCHC: 34.8 g/dL (ref 30.0–36.0)
MCV: 86.4 fL (ref 80.0–100.0)
Platelets: 254 10*3/uL (ref 150–400)
RBC: 3.39 MIL/uL — ABNORMAL LOW (ref 3.87–5.11)
RDW: 14.6 % (ref 11.5–15.5)
WBC: 11.7 10*3/uL — ABNORMAL HIGH (ref 4.0–10.5)
nRBC: 0 % (ref 0.0–0.2)

## 2024-03-14 LAB — TYPE AND SCREEN
ABO/RH(D): A POS
Antibody Screen: NEGATIVE

## 2024-03-14 MED ORDER — SOD CITRATE-CITRIC ACID 500-334 MG/5ML PO SOLN: 30.0000 mL | ORAL | Status: DC | PRN

## 2024-03-14 MED ORDER — EPHEDRINE 5 MG/ML INJ: 10.0000 mg | INTRAVENOUS | Status: DC | PRN

## 2024-03-14 MED ORDER — OXYTOCIN-SODIUM CHLORIDE 30-0.9 UT/500ML-% IV SOLN
2.5000 [IU]/h | INTRAVENOUS | Status: DC
  Administered 2024-03-14: 2.5 [IU]/h via INTRAVENOUS

## 2024-03-14 MED ORDER — LACTATED RINGERS IV SOLN: INTRAVENOUS | Status: DC

## 2024-03-14 MED ORDER — FENTANYL-BUPIVACAINE-NACL 0.5-0.125-0.9 MG/250ML-% EP SOLN
12.0000 mL/h | EPIDURAL | Status: DC | PRN
  Administered 2024-03-14: 12 mL/h via EPIDURAL

## 2024-03-14 MED ORDER — OXYTOCIN 10 UNIT/ML IJ SOLN
INTRAMUSCULAR | Status: DC
Start: 2024-03-14 — End: 2024-03-15
  Filled 2024-03-14: qty 2

## 2024-03-14 MED ORDER — DIPHENHYDRAMINE HCL 50 MG/ML IJ SOLN: 12.5000 mg | INTRAMUSCULAR | Status: DC | PRN

## 2024-03-14 MED ORDER — PHENYLEPHRINE 80 MCG/ML (10ML) SYRINGE FOR IV PUSH (FOR BLOOD PRESSURE SUPPORT): 80.0000 ug | PREFILLED_SYRINGE | INTRAVENOUS | Status: DC | PRN

## 2024-03-14 MED ORDER — LIDOCAINE-EPINEPHRINE (PF) 1.5 %-1:200000 IJ SOLN
INTRAMUSCULAR | Status: DC | PRN
  Administered 2024-03-14: 3 mL via EPIDURAL

## 2024-03-14 MED ORDER — CALCIUM CARBONATE ANTACID 500 MG PO CHEW
CHEWABLE_TABLET | ORAL | Status: AC
  Filled 2024-03-14: qty 2

## 2024-03-14 MED ORDER — FENTANYL-BUPIVACAINE-NACL 0.5-0.125-0.9 MG/250ML-% EP SOLN
EPIDURAL | Status: AC
  Filled 2024-03-14: qty 250

## 2024-03-14 MED ORDER — BUPIVACAINE HCL (PF) 0.25 % IJ SOLN
INTRAMUSCULAR | Status: DC | PRN
  Administered 2024-03-14: 8 mL via EPIDURAL

## 2024-03-14 MED ORDER — MISOPROSTOL 200 MCG PO TABS
ORAL_TABLET | ORAL | Status: AC
  Filled 2024-03-14: qty 4

## 2024-03-14 MED ORDER — OXYTOCIN BOLUS FROM INFUSION
333.0000 mL | Freq: Once | INTRAVENOUS | Status: AC
  Administered 2024-03-14: 333 mL via INTRAVENOUS

## 2024-03-14 MED ORDER — LIDOCAINE HCL (PF) 1 % IJ SOLN
INTRAMUSCULAR | Status: AC
  Filled 2024-03-14: qty 30

## 2024-03-14 MED ORDER — OXYTOCIN-SODIUM CHLORIDE 30-0.9 UT/500ML-% IV SOLN
INTRAVENOUS | Status: AC
  Filled 2024-03-14: qty 500

## 2024-03-14 MED ORDER — LACTATED RINGERS IV SOLN: 500.0000 mL | Freq: Once | INTRAVENOUS | Status: DC

## 2024-03-14 MED ORDER — LIDOCAINE HCL (PF) 1 % IJ SOLN: 30.0000 mL | INTRAMUSCULAR | Status: DC | PRN

## 2024-03-14 MED ORDER — ACETAMINOPHEN 325 MG PO TABS: 650.0000 mg | ORAL_TABLET | ORAL | Status: DC | PRN

## 2024-03-14 MED ORDER — ONDANSETRON HCL 4 MG/2ML IJ SOLN
4.0000 mg | Freq: Four times a day (QID) | INTRAMUSCULAR | Status: DC | PRN
  Administered 2024-03-14: 4 mg via INTRAVENOUS
  Filled 2024-03-14: qty 2

## 2024-03-14 MED ORDER — AMMONIA AROMATIC IN INHA
RESPIRATORY_TRACT | Status: AC
  Filled 2024-03-14: qty 10

## 2024-03-14 MED ORDER — FENTANYL CITRATE (PF) 100 MCG/2ML IJ SOLN: 50.0000 ug | INTRAMUSCULAR | Status: DC | PRN

## 2024-03-14 MED ORDER — LACTATED RINGERS IV SOLN: 500.0000 mL | INTRAVENOUS | Status: DC | PRN

## 2024-03-14 NOTE — Progress Notes (Signed)
 Labor Progress Note  Donna Reeves is a 28 y.o. G3P2002 at [redacted]w[redacted]d by LMP admitted for active labor  Subjective: she is comfortable after her epidural  Objective: BP (!) 102/55   Pulse 80   Temp 98.5 F (36.9 C) (Oral)   Resp 18   Ht 5\' 3"  (1.6 m)   Wt 79.4 kg   LMP 06/11/2023   SpO2 100%   BMI 31.00 kg/m  Notable VS details: reviewed  Fetal Assessment: FHT:  FHR: 115 bpm, variability: moderate,  accelerations:  Present,  decelerations:  Absent Category/reactivity:  Category I UC:   regular, every 2-3 minutes SVE:    Dilation: 8.5cm  Effacement: 100%  Station:  0  Consistency: soft  Position: anterior  Membrane status:AROM @ 2126 Amniotic color: clear  Labs: Lab Results  Component Value Date   WBC 11.7 (H) 03/14/2024   HGB 10.2 (L) 03/14/2024   HCT 29.3 (L) 03/14/2024   MCV 86.4 03/14/2024   PLT 254 03/14/2024    Assessment / Plan: 28 year old G3P2002 at [redacted]w[redacted]d here with active labor  Labor:  AROM with clear fluid, good labor progress Preeclampsia:   BP 102/55 Fetal Wellbeing:  Category I Pain Control:  Epidural I/D:   GBS negative Anticipated MOD:  NSVD  Janyce Llanos, CNM 03/14/2024, 9:30 PM

## 2024-03-14 NOTE — Progress Notes (Signed)
 Pt presents to L/D triage for labor that began to increase in intensity at 1715. Pt reports no bleeding or LOG and positive fetal movement. Pt rates pain with ctx 9/10, q4-5 min.  VSS. Initial FHR 125. SVE 5/80/-2.  CNM notified. Pt to be admitted,

## 2024-03-14 NOTE — Progress Notes (Signed)
 Labor Progress Note  Donna Reeves is a 28 y.o. G3P2002 at [redacted]w[redacted]d by LMP admitted for active labor  Subjective: she reports starting to feel rectal pressure with contractions  Objective: BP (!) 106/55   Pulse 88   Temp 98.5 F (36.9 C) (Oral)   Resp 18   Ht 5\' 3"  (1.6 m)   Wt 79.4 kg   LMP 06/11/2023   SpO2 100%   BMI 31.00 kg/m  Notable VS details: reviewed  Fetal Assessment: FHT:  FHR: 110 bpm, variability: moderate,  accelerations:  Present,  decelerations:  Absent Category/reactivity:  Category I UC:   regular, every 2-3 minutes SVE:    Dilation: 9.5cm  Effacement: 100%  Station:  +1  Consistency: soft  Position: anterior  Membrane status:AROM @ 2126 Amniotic color: clear  Labs: Lab Results  Component Value Date   WBC 11.7 (H) 03/14/2024   HGB 10.2 (L) 03/14/2024   HCT 29.3 (L) 03/14/2024   MCV 86.4 03/14/2024   PLT 254 03/14/2024    Assessment / Plan: 28 year old G3P2002 at [redacted]w[redacted]d here with active labor  Labor:  good labor progress, will sit upright to allow for fetal descent, anticipate second stage shortly Preeclampsia:   BP 106/55 Fetal Wellbeing:  Category I Pain Control:  Epidural I/D:   GBS negative Anticipated MOD:  NSVD  Janyce Llanos, CNM 03/14/2024, 10:32 PM

## 2024-03-14 NOTE — H&P (Signed)
 OB History & Physical   History of Present Illness:  Chief Complaint: uterine contractions  HPI:  Donna Reeves is a 28 y.o. [id number] female at [redacted]w[redacted]d dated by LMP c/w [place].  She presents to L&D for uterine contractions that started at 5:30pm. She was seen in the office today and had a membrane sweep. She reports good fetal movement and denies leaking fluid.     Pregnancy Issues: 1. Closely spaced pregnancy 2. Anemia 3. Hx of Bell's Palsy 4. Rubella and varicella non-immune 5. Elevated 1hr GTT, passed 3hr GTT   Maternal Medical History:   Past Medical History:  Diagnosis Date   Bell's palsy    Degenerative disc disease    Herniated disc    Sciatica     Past Surgical History:  Procedure Laterality Date   LAPAROSCOPIC APPENDECTOMY N/A 01/23/2021   Procedure: APPENDECTOMY LAPAROSCOPIC;  Surgeon: Franky Macho, MD;  Location: [facility];  Service: General;  Laterality: N/A;    Allergies  Allergen Reactions   Ultram [Tramadol] Nausea Only and Other (See Comments)    headache   Augmentin [Amoxicillin-Pot Clavulanate] Other (See Comments)    Unknown.   Cipro [Ciprofloxacin Hcl] Swelling and Rash    Arm started "getting red and swollen" 01/23/21    Prior to Admission medications   Medication Sig Start Date End Date Taking? Authorizing Provider  acetaminophen (TYLENOL) 325 MG tablet Take 2 tablets (650 mg total) by mouth every 4 (four) hours as needed (for pain scale < 4). 01/27/23  Yes Gustavo Lah, CNM  ondansetron (ZOFRAN-ODT) 4 MG disintegrating tablet Take 1 tablet (4 mg total) by mouth every 8 (eight) hours as needed for nausea or vomiting. Patient not taking: Reported on 03/14/2024 12/29/23   Haroldine Laws, CNM  Prenatal Vit-Fe Fumarate-FA (PRENATAL MULTIVITAMIN) TABS tablet Take 1 tablet by mouth daily at 12 noon. Patient not taking: Reported on 03/14/2024 01/27/23   Gustavo Lah, CNM    Prenatal care site: [facility]   Social History: She  reports  that she has never smoked. She has never used smokeless tobacco. She reports that she does not drink alcohol and does not use drugs.  Family History: family history is not on file.   Review of Systems: A full review of systems was performed and negative except as noted in the HPI.     Physical Exam:  Vital Signs: BP 123/74 (BP Location: Left Arm)   Pulse 93   Temp 98.5 F (36.9 C) (Oral)   Resp 18   Ht 5\' 3"  (1.6 m)   Wt 79.4 kg   LMP 06/11/2023   BMI 31.00 kg/m  General: no acute distress.  HEENT: normocephalic, atraumatic Heart: regular rate & rhythm.  No murmurs/rubs/gallops Lungs: clear to auscultation bilaterally, normal respiratory effort Abdomen: soft, gravid, non-tender;  EFW: 7.5lb Pelvic:   External: Normal external female genitalia  Cervix: Dilation: 5 / Effacement (%): 80 / Station: -2    Extremities: non-tender, symmetric, no edema bilaterally.  DTRs: +2  Neurologic: Alert & oriented x 3.    No results found for this or any previous visit (from the past 24 hours).  Pertinent Results:  Prenatal Labs: Blood type/Rh A positive  Antibody screen neg  Rubella Non-Immune  Varicella Non-Immune  RPR NR  HBsAg Neg  HIV NR  GC neg  Chlamydia neg  Genetic screening negative  1 hour GTT 139  3 hour GTT 93, 150, 106, 126   GBS Negative  FHT: 110bpm, moderate variability, accelerations present, intermittent variable decelerations, Category II, overall reassuring TOCO: contractions q3-75min, palpate moderate SVE:  Dilation: 5 / Effacement (%): 80 / Station: -2    Cephalic by leopolds  US FETAL BPP WO NON STRESS Result Date: 02/18/2024 CLINICAL DATA:  Decreased fetal movement. EXAM: LIMITED OBSTETRIC ULTRASOUND AND BIOPHYSICAL PROFILE FINDINGS: Number of Fetuses: 1 Heart Rate:  125 bpm Movement: Yes Presentation: Cephalic Previa: No (distance to internal os equals 11.8 cm) Placental Location: Posterior Amniotic Fluid (Subjective): AFI 14.5 cm (5%ile= 7.7 cm, 95%=  29.9 cm for 36 wks) FL: 7.00cm 35w 6d Maternal Findings: Cervix:  N/A Uterus/Adnexae: No abnormality visualized. BIOPHYSICAL PROFILE Movement: 2 time: 28 minutes Breathing: 2 Tone:  2 Amniotic Fluid: 2 Total Score:  8 IMPRESSION: Single, viable intrauterine pregnancy at approximately 35 weeks and 6 days gestation by ultrasound evaluation. Biophysical profile score is 8 out of 8. Electronically Signed   By: Aram Candela M.D.   On: 02/18/2024 19:51   US OB Limited Result Date: 02/18/2024 CLINICAL DATA:  Decreased fetal movement. EXAM: LIMITED OBSTETRIC ULTRASOUND AND BIOPHYSICAL PROFILE FINDINGS: Number of Fetuses: 1 Heart Rate:  125 bpm Movement: Yes Presentation: Cephalic Previa: No (distance to internal os equals 11.8 cm) Placental Location: Posterior Amniotic Fluid (Subjective): AFI 14.5 cm (5%ile= 7.7 cm, 95%= 29.9 cm for 36 wks) FL: 7.00cm 35w 6d Maternal Findings: Cervix:  N/A Uterus/Adnexae: No abnormality visualized. BIOPHYSICAL PROFILE Movement: 2 time: 28 minutes Breathing: 2 Tone:  2 Amniotic Fluid: 2 Total Score:  8 IMPRESSION: Single, viable intrauterine pregnancy at approximately 35 weeks and 6 days gestation by ultrasound evaluation. Biophysical profile score is 8 out of 8. Electronically Signed   By: Aram Candela M.D.   On: 02/18/2024 19:51    Assessment:  Donna Reeves is a 28 y.o. G28P2002 female at [redacted]w[redacted]d with uterine contractions and active labor.   Plan:  1. Admit to Labor & Delivery; consents reviewed and obtained - Dr. Jean Rosenthal notified of admission  2. Fetal Well being  - Fetal Tracing: Category II tracing, two variable decelerations with quick recovery, good accelerations and overall reassuring afterward - Group B Streptococcus ppx indicated: n/a, GBS negative - Presentation: vertex confirmed by SVE   3. Routine OB: - Prenatal labs reviewed, as above - Rh positive - CBC, T&S, RPR on admit - Clear fluids, saline lock  4. Monitoring of Labor -   Contractions q3-68min, external toco in place -  Pelvis proven to 3680g, adequate for trial of labor -  Plan for augmentation with AROM and/or pitocin if indicated, not indicated at this time. Cervix quickly changed from 5-7cm. -  Plan for continuous fetal monitoring  -  Maternal pain control as desired; requesting regional anesthesia - Anticipate vaginal delivery  5. Post Partum Planning: - Infant feeding: breastfeeding - Contraception: NFP - tdap: declined - flu: declined  Janyce Llanos, CNM 03/14/24 7:04 PM

## 2024-03-14 NOTE — Anesthesia Procedure Notes (Signed)
 Epidural Patient location during procedure: OB Start time: 03/14/2024 7:30 PM End time: 03/14/2024 7:39 PM  Staffing Anesthesiologist: Reed Breech, MD Performed: anesthesiologist   Preanesthetic Checklist Completed: patient identified, IV checked, risks and benefits discussed, surgical consent, monitors and equipment checked, pre-op evaluation and timeout performed  Epidural Patient position: sitting Prep: ChloraPrep Patient monitoring: heart rate, continuous pulse ox and blood pressure Approach: midline Location: L2-L3 Injection technique: LOR air  Needle:  Needle type: Tuohy  Needle gauge: 17 G Needle length: 9 cm Needle insertion depth: 5 cm Catheter at skin depth: 10 cm Test dose: negative and 1.5% lidocaine with Epi 1:200 K  Assessment Sensory level: T4  Additional Notes Straightforward placement without apparent complications. Reason for block:procedure for pain

## 2024-03-14 NOTE — Discharge Summary (Addendum)
 Postpartum Discharge Summary  Patient Name: Donna Reeves DOB: May 19, 1996 MRN: [id number]  Date of admission: 03/14/2024 Delivery date:03/14/2024 Delivering provider: Donato Schultz RENEE Date of discharge: 03/16/2024  Primary OB: [facility] YQM:[id number] last menstrual period was 06/11/2023. EDC Estimated Date of Delivery: 03/17/24 Gestational Age at Delivery: [redacted]w[redacted]d   Admitting diagnosis: Uterine contractions [O47.9] Intrauterine pregnancy: [redacted]w[redacted]d     Secondary diagnosis:   Principal Problem:   NSVD (normal spontaneous vaginal delivery) Active Problems:   Rubella nonimmune status, delivered, current hospitalization   Uterine contractions   Short interval between pregnancies affecting pregnancy, antepartum   History of Bell's palsy   Elevated glucose tolerance test   Anemia affecting pregnancy   Discharge Diagnosis: Term Pregnancy Delivered                                                Post partum procedures: None  Augmentation:: AROM Complications: None Delivery Type: spontaneous vaginal delivery Anesthesia: epidural anesthesia Placenta: spontaneous To Pathology: No  Laceration: left periurethral Episiotomy: none  Prenatal Labs:  Blood type/Rh A positive  Antibody screen neg  Rubella Non-Immune  Varicella Non-Immune  RPR NR  HBsAg Neg  HIV NR  GC neg  Chlamydia neg  Genetic screening negative  1 hour GTT 139  3 hour GTT 93, 150, 106, 126   GBS Negative    Hospital course: Onset of Labor With Vaginal Delivery      28 y.o. yo G3P3003 at [redacted]w[redacted]d was admitted in Active Labor on 03/14/2024. Labor course was without complication. She received an epidural, AROM with clear fluid, then progressed to 10/100/+2, pushed 1 minute, and delivered viable female infant over intact perineum. Apgars 9/9. Membrane Rupture Time/Date: 9:26 PM,03/14/2024  Delivery Method:Vaginal, Spontaneous Operative Delivery:N/A Episiotomy: NoneNone Lacerations:  NoneNone   Patient had an uncomplicated postpartum course.  She is ambulating, tolerating a regular diet, passing flatus, and urinating well. Patient is discharged home in stable condition on 03/16/24.  Newborn Data: "Hermalinda" Birth date:03/14/2024 Birth time:11:01 PM Gender:Female Living status:Living Apgars:9 ,9  Weight:3460 g  Magnesium Sulfate received: No BMZ received: No Rhophylac:No MMR: indicated; vaccine offered pp prior to discharge; Patient declined Varivax vaccine given: indicated; vaccine offered pp prior to discharge; Patient declined  T-DaP: declined Flu: N/A  Transfusion:No  Physical exam  Vitals:   03/15/24 1152 03/15/24 1516 03/15/24 2303 03/16/24 0757  BP: 110/62 115/78 113/72 112/78  Pulse: 60 73 64 (!) 58  Resp: 18 18 16 18   Temp: 98.4 F (36.9 C) 98 F (36.7 C) (!) 97.4 F (36.3 C) 97.8 F (36.6 C)  TempSrc: Oral Oral Oral Oral  SpO2:   99% 100%  Weight:      Height:       General: alert, cooperative, and no distress Lochia: appropriate Uterine Fundus: firm Perineum:minimal edema/intact DVT Evaluation: No evidence of DVT seen on physical exam.  Labs: Lab Results  Component Value Date   WBC 13.4 (H) 03/15/2024   HGB 10.1 (L) 03/15/2024   HCT 28.6 (L) 03/15/2024   MCV 86.1 03/15/2024   PLT 214 03/15/2024      Latest Ref Rng & Units 12/29/2023    8:31 PM  CMP  Glucose 70 - 99 mg/dL 86   BUN 6 - 20 mg/dL 9   Creatinine 9.52 - 8.41 mg/dL 3.24   Sodium 401 - 027  mmol/L 135   Potassium 3.5 - 5.1 mmol/L 3.9   Chloride 98 - 111 mmol/L 103   CO2 22 - 32 mmol/L 19   Calcium 8.9 - 10.3 mg/dL 8.8   Total Protein 6.5 - 8.1 g/dL 7.0   Total Bilirubin 0.0 - 1.2 mg/dL 1.1   Alkaline Phos 38 - 126 U/L 105   AST 15 - 41 U/L 15   ALT 0 - 44 U/L 16    Edinburgh Score:    03/15/2024    3:10 AM  Edinburgh Postnatal Depression Scale Screening Tool  I have been able to laugh and see the funny side of things. 0  I have looked forward with enjoyment to  things. 0  I have blamed myself unnecessarily when things went wrong. 1  I have been anxious or worried for no good reason. 1  I have felt scared or panicky for no good reason. 0  Things have been getting on top of me. 0  I have been so unhappy that I have had difficulty sleeping. 0  I have felt sad or miserable. 0  I have been so unhappy that I have been crying. 0  The thought of harming myself has occurred to me. 0  Edinburgh Postnatal Depression Scale Total 2    Risk assessment for postpartum VTE and prophylactic treatment: Very high risk factors: None High risk factors: None Moderate risk factors: None  Postpartum VTE prophylaxis with LMWH not indicated  After visit meds:  Allergies as of 03/16/2024       Reactions   Ultram [tramadol] Nausea Only, Other (See Comments)   headache   Augmentin [amoxicillin-pot Clavulanate] Other (See Comments)   Unknown.   Cipro [ciprofloxacin Hcl] Swelling, Rash   Arm started "getting red and swollen" 01/23/21        Medication List     STOP taking these medications    ondansetron 4 MG disintegrating tablet Commonly known as: ZOFRAN-ODT       TAKE these medications    acetaminophen 325 MG tablet Commonly known as: Tylenol Take 2 tablets (650 mg total) by mouth every 4 (four) hours as needed (for pain scale < 4).   benzocaine-Menthol 20-0.5 % Aero Commonly known as: DERMOPLAST Apply 1 Application topically as needed for irritation (perineal discomfort).   coconut oil Oil Apply 1 Application topically as needed.   dibucaine 1 % Oint Commonly known as: NUPERCAINAL Place 1 Application rectally as needed for hemorrhoids.   ferrous sulfate 325 (65 FE) MG EC tablet Take 1 tablet (325 mg total) by mouth every Monday, Wednesday, and Friday. Start taking on: March 17, 2024   ibuprofen 600 MG tablet Commonly known as: ADVIL Take 1 tablet (600 mg total) by mouth every 6 (six) hours as needed.   prenatal multivitamin Tabs  tablet Take 1 tablet by mouth daily at 12 noon.   senna-docusate 8.6-50 MG tablet Commonly known as: Senokot-S Take 2 tablets by mouth daily.   witch hazel-glycerin pad Commonly known as: TUCKS Apply 1 Application topically as needed for hemorrhoids.       Discharge home in stable condition Infant Feeding: Breast Infant Disposition:home with mother Discharge instruction: per After Visit Summary and Postpartum booklet. Activity: Advance as tolerated. Pelvic rest for 6 weeks.  Diet: routine diet Anticipated Birth Control:  NFP Postpartum Appointment:6 weeks Additional Postpartum F/U:  None  Future Appointments:No future appointments. Follow up Visit:  Follow-up Information     Janyce Llanos, CNM Follow up  in 6 week(s).   Specialty: Certified Nurse Midwife Why: 6wk postpartum Contact information: [address] [place] [address] [phone]                 Plan:  Donna Reeves was discharged to home in good condition. - Patient advised to continue to take iron supplements every other day until 6wk pp visit to increase     iron levels. Patient verbalized understanding.  - Hgb:  10.1 (03/16/2024) - Asymptomatic  - Will plan to re-check hgb at 6wk pp visit.  Follow-up appointment as directed.   SignedRoney Jaffe, CNM 03/16/2024 12:57 PM

## 2024-03-14 NOTE — Anesthesia Preprocedure Evaluation (Addendum)
 Anesthesia Evaluation  Patient identified by MRN, date of birth, ID band Patient awake    Reviewed: Allergy & Precautions, NPO status , Patient's Chart, lab work & pertinent test results  History of Anesthesia Complications Negative for: history of anesthetic complications  Airway Mallampati: III   Neck ROM: Full    Dental   Pulmonary neg pulmonary ROS   Pulmonary exam normal breath sounds clear to auscultation       Cardiovascular Exercise Tolerance: Good Normal cardiovascular exam Rhythm:Regular Rate:Normal     Neuro/Psych  Headaches  Neuromuscular disease (Bell's palsy; chronic back pain)    GI/Hepatic negative GI ROS,,,  Endo/Other  negative endocrine ROS    Renal/GU negative Renal ROS     Musculoskeletal   Abdominal   Peds  Hematology  (+) Blood dyscrasia, anemia   Anesthesia Other Findings 28 yo G3P2002 at 55 4/7 requesting labor epidural.  Reproductive/Obstetrics (+) Pregnancy                             Anesthesia Physical Anesthesia Plan  ASA: 2  Anesthesia Plan: Epidural   Post-op Pain Management:    Induction:   PONV Risk Score and Plan: 2 and Treatment may vary due to age or medical condition  Airway Management Planned: Natural Airway  Additional Equipment:   Intra-op Plan:   Post-operative Plan:   Informed Consent: I have reviewed the patients History and Physical, chart, labs and discussed the procedure including the risks, benefits and alternatives for the proposed anesthesia with the patient or authorized representative who has indicated his/her understanding and acceptance.     Dental Advisory Given  Plan Discussed with:   Anesthesia Plan Comments: (Patient reports no bleeding problems and no anticoagulant use.   Patient consented for risks of anesthesia including but not limited to:  - adverse reactions to medications - risk of bleeding, infection  and or nerve damage from epidural that could lead to paralysis - risk of headache or failed epidural - nerve damage due to positioning - that if epidural is used for C-section that there is a chance of epidural failure requiring spinal placement or conversion to GA - damage to heart, brain, lungs, other parts of body or loss of life  Patient voiced understanding and assent.)       Anesthesia Quick Evaluation

## 2024-03-15 ENCOUNTER — Encounter: Payer: Self-pay | Admitting: Obstetrics and Gynecology

## 2024-03-15 LAB — CBC
HCT: 28.6 % — ABNORMAL LOW (ref 36.0–46.0)
Hemoglobin: 10.1 g/dL — ABNORMAL LOW (ref 12.0–15.0)
MCH: 30.4 pg (ref 26.0–34.0)
MCHC: 35.3 g/dL (ref 30.0–36.0)
MCV: 86.1 fL (ref 80.0–100.0)
Platelets: 214 10*3/uL (ref 150–400)
RBC: 3.32 MIL/uL — ABNORMAL LOW (ref 3.87–5.11)
RDW: 14.4 % (ref 11.5–15.5)
WBC: 13.4 10*3/uL — ABNORMAL HIGH (ref 4.0–10.5)
nRBC: 0 % (ref 0.0–0.2)

## 2024-03-15 LAB — RPR: RPR Ser Ql: NONREACTIVE

## 2024-03-15 MED ORDER — ZOLPIDEM TARTRATE 5 MG PO TABS: 5.0000 mg | ORAL_TABLET | Freq: Every evening | ORAL | Status: DC | PRN

## 2024-03-15 MED ORDER — ACETAMINOPHEN 325 MG PO TABS
650.0000 mg | ORAL_TABLET | ORAL | Status: DC | PRN
  Administered 2024-03-15 (×2): 650 mg via ORAL
  Filled 2024-03-15 (×2): qty 2

## 2024-03-15 MED ORDER — TETANUS-DIPHTH-ACELL PERTUSSIS 5-2.5-18.5 LF-MCG/0.5 IM SUSY: 0.5000 mL | PREFILLED_SYRINGE | Freq: Once | INTRAMUSCULAR | Status: DC

## 2024-03-15 MED ORDER — WITCH HAZEL-GLYCERIN EX PADS: 1.0000 | MEDICATED_PAD | CUTANEOUS | Status: DC | PRN

## 2024-03-15 MED ORDER — MEASLES, MUMPS & RUBELLA VAC IJ SOLR
0.5000 mL | Freq: Once | INTRAMUSCULAR | Status: DC
  Filled 2024-03-15: qty 0.5

## 2024-03-15 MED ORDER — BENZOCAINE-MENTHOL 20-0.5 % EX AERO: 1.0000 | INHALATION_SPRAY | CUTANEOUS | Status: DC | PRN

## 2024-03-15 MED ORDER — SENNOSIDES-DOCUSATE SODIUM 8.6-50 MG PO TABS
2.0000 | ORAL_TABLET | Freq: Every day | ORAL | Status: DC
  Administered 2024-03-15 – 2024-03-16 (×2): 2 via ORAL
  Filled 2024-03-15 (×2): qty 2

## 2024-03-15 MED ORDER — VARICELLA VIRUS VACCINE LIVE 1350 PFU/0.5ML IJ SUSR
0.5000 mL | INTRAMUSCULAR | Status: DC | PRN
  Filled 2024-03-15: qty 0.5

## 2024-03-15 MED ORDER — SIMETHICONE 80 MG PO CHEW: 80.0000 mg | CHEWABLE_TABLET | ORAL | Status: DC | PRN

## 2024-03-15 MED ORDER — CALCIUM CARBONATE ANTACID 500 MG PO CHEW: 2.0000 | CHEWABLE_TABLET | Freq: Once | ORAL | Status: AC

## 2024-03-15 MED ORDER — OXYCODONE HCL 5 MG PO TABS
5.0000 mg | ORAL_TABLET | ORAL | Status: DC | PRN
  Administered 2024-03-15: 5 mg via ORAL
  Filled 2024-03-15: qty 1

## 2024-03-15 MED ORDER — DIBUCAINE (PERIANAL) 1 % EX OINT: 1.0000 | TOPICAL_OINTMENT | CUTANEOUS | Status: DC | PRN

## 2024-03-15 MED ORDER — ONDANSETRON HCL 4 MG/2ML IJ SOLN: 4.0000 mg | INTRAMUSCULAR | Status: DC | PRN

## 2024-03-15 MED ORDER — PRENATAL MULTIVITAMIN CH
1.0000 | ORAL_TABLET | Freq: Every day | ORAL | Status: DC
  Administered 2024-03-15 – 2024-03-16 (×2): 1 via ORAL
  Filled 2024-03-15 (×2): qty 1

## 2024-03-15 MED ORDER — DIPHENHYDRAMINE HCL 25 MG PO CAPS: 25.0000 mg | ORAL_CAPSULE | Freq: Four times a day (QID) | ORAL | Status: DC | PRN

## 2024-03-15 MED ORDER — COCONUT OIL OIL
1.0000 | TOPICAL_OIL | Status: DC | PRN
  Filled 2024-03-15: qty 7.5

## 2024-03-15 MED ORDER — ONDANSETRON HCL 4 MG PO TABS: 4.0000 mg | ORAL_TABLET | ORAL | Status: DC | PRN

## 2024-03-15 MED ORDER — IBUPROFEN 600 MG PO TABS
600.0000 mg | ORAL_TABLET | Freq: Four times a day (QID) | ORAL | Status: DC
  Administered 2024-03-15 – 2024-03-16 (×6): 600 mg via ORAL
  Filled 2024-03-15 (×6): qty 1

## 2024-03-15 NOTE — Lactation Note (Signed)
 This note was copied from a baby's chart. Lactation Consultation Note  Patient Name: Donna Reeves [id number] Date: 03/15/2024 Age:27 hours Reason for consult: Initial assessment;Term   Maternal Data Has patient been taught Hand Expression?: Yes Does the patient have breastfeeding experience prior to this delivery?: Yes How long did the patient breastfeed?: appx 4-6 months with last child  Initial assessment for a G3P3, [redacted]w[redacted]d GA, at 11 hours. MOB has anemia and closed spaced pregnancy.  LC did not observe feed. Patient reports that baby may have a shallow latch due to sore nipple on the left breast and potentially wants LC to observe at a later feed if needed. Coconut oils and hydrogels recommended by LC. Patient knows how to call for help.  Feeding Mother's Current Feeding Choice: Breast Milk   Lactation Tools Discussed/Used Tools: Coconut oil;Comfort gels  Interventions Interventions: Breast feeding basics reviewed;Coconut oil;Comfort gels;Education LC provided education on the following;  milk production expectations, hand expression, benefits of STS and arousing infant for a feeding.  Lactation informed patient of feeding infant at least 8 or more times w/in a 24hr period but not exceeding 3hrs. Patient verbalized understanding.   Discharge Pump: DEBP (Spectra 2 and hand pump) WIC Program: No  Consult Status Consult Status: Follow-up Follow-up type: In-patient    Donna Reeves 03/15/2024, 12:27 PM

## 2024-03-15 NOTE — Anesthesia Postprocedure Evaluation (Signed)
 Anesthesia Post Note  Patient: Donna Reeves  Procedure(s) Performed: AN AD HOC LABOR EPIDURAL  Patient location during evaluation: Mother Baby Anesthesia Type: Epidural Level of consciousness: oriented and awake and alert Pain management: pain level controlled Vital Signs Assessment: post-procedure vital signs reviewed and stable Respiratory status: spontaneous breathing and respiratory function stable Cardiovascular status: blood pressure returned to baseline and stable Postop Assessment: no headache, no backache, no apparent nausea or vomiting and able to ambulate Anesthetic complications: no   No notable events documented.   Last Vitals:  Vitals:   03/15/24 0215 03/15/24 0308  BP: 120/71 112/84  Pulse: 89 78  Resp: 20 18  Temp: 36.5 C 36.5 C  SpO2: 100% 100%    Last Pain:  Vitals:   03/15/24 0310  TempSrc:   PainSc: 0-No pain                 Starling Manns

## 2024-03-15 NOTE — Progress Notes (Signed)
 Postpartum Day  1  Subjective: 28 y.o. G3P3003 postpartum day #1 status post normal spontaneous vaginal delivery. She is ambulating, is tolerating po, is voiding spontaneously.  Her pain is well controlled on PO pain medications. Her lochia is less than menses.  Objective: BP 108/69 (BP Location: Right Arm)   Pulse 81   Temp 97.9 F (36.6 C) (Oral)   Resp 18   Ht 5\' 3"  (1.6 m)   Wt 79.4 kg   LMP 06/11/2023   SpO2 99%   Breastfeeding Unknown   BMI 31.00 kg/m    Physical Exam:  General: alert, cooperative, and no distress Breasts: soft/nontender Pulm: nl effort Abdomen: soft, non-tender, active bowel sounds Uterine Fundus: firm Perineum: minimal edema, intact Lochia: appropriate DVT Evaluation: No evidence of DVT seen on physical exam.  Recent Labs    03/14/24 1923 03/15/24 0443  HGB 10.2* 10.1*  HCT 29.3* 28.6*  WBC 11.7* 13.4*  PLT 254 214    Assessment/Plan: 28 y.o. G3P3003 postpartum day # 1  1. Continue routine postpartum care  2. Infant feeding status: breast feeding -Lactation consult PRN for breastfeeding   3. Contraception plan:  NFP  4. Iron deficiency anemia in pregnancy - clinically not significant .  -Hemodynamically stable and asymptomatic -Intervention: continue on oral supplementation with ferrous sulfate 325  5. Immunization status:   all immunizations up to date  Disposition: continue inpatient postpartum care , plan for discharge home tomorrow    LOS: 1 day   Gustavo Lah, CNM 03/15/2024, 11:37 AM   ----- Margaretmary Eddy  Certified Nurse Midwife [facility]

## 2024-03-16 MED ORDER — SENNOSIDES-DOCUSATE SODIUM 8.6-50 MG PO TABS: 2.0000 | ORAL_TABLET | Freq: Every day | ORAL | Status: DC

## 2024-03-16 MED ORDER — COCONUT OIL OIL: 1.0000 | TOPICAL_OIL | Status: DC | PRN

## 2024-03-16 MED ORDER — IBUPROFEN 600 MG PO TABS: 600.0000 mg | ORAL_TABLET | Freq: Four times a day (QID) | ORAL | 0 refills | Status: AC | PRN

## 2024-03-16 MED ORDER — WITCH HAZEL-GLYCERIN EX PADS: 1.0000 | MEDICATED_PAD | CUTANEOUS | Status: DC | PRN

## 2024-03-16 MED ORDER — FERROUS SULFATE 325 (65 FE) MG PO TABS: 325.0000 mg | ORAL_TABLET | ORAL | Status: DC

## 2024-03-16 MED ORDER — DIBUCAINE (PERIANAL) 1 % EX OINT: 1.0000 | TOPICAL_OINTMENT | CUTANEOUS | Status: DC | PRN

## 2024-03-16 MED ORDER — BENZOCAINE-MENTHOL 20-0.5 % EX AERO
1.0000 | INHALATION_SPRAY | CUTANEOUS | Status: DC | PRN
Start: 2024-03-16 — End: 2024-11-01

## 2024-03-16 MED ORDER — FERROUS SULFATE 325 (65 FE) MG PO TBEC: 325.0000 mg | DELAYED_RELEASE_TABLET | ORAL | 1 refills | Status: AC

## 2024-03-16 NOTE — Lactation Note (Signed)
 This note was copied from a baby's chart. Lactation Consultation Note  Patient Name: Donna Reeves [id number] Date: 03/16/2024 Age:27 hours Reason for consult: Nipple pain/trauma;Maternal discharge;Follow-up assessment G3P3 experienced ebf maternal pt reports infant fed for approx 25 min prior to [facility] room entry for discharge preparation education. Pt eating with infant sleeping and swaddled with support person and reports nipple pain is currently being managed with utilization of OTC topical balm and no observable bleeding or abrasion present via LC observation.   Maternal Data Does the patient have breastfeeding experience prior to this delivery?: Yes  Feeding Mother's Current Feeding Choice: Breast Milk     Lactation Tools Discussed/Used  Discussed home plan for feeding and DEBP/hand pump available for personal use if desired in the event separation from infant should occur. LC name and phone number left on board in the event pt should request feeding observation prior to discharge to determine if narrow gape or improper latch is causing trama to nipple region. Pt made aware of outpatient options for additional assessment should any other concerns or questions arise.  Interventions Interventions: Breast feeding basics reviewed;Ice;Education LC education provided regarding engorgement management and breast care as well as warning signs for mastitis and ABM protocol updates. Pt aware of infant feeding cues and normative infant behavior and what circumstances necessitate pediatrician intervention and/or appointment with onsite outpatient clinic. Discharge Discharge Education: Engorgement and breast care;Warning signs for feeding baby;Outpatient recommendation (Advised pt of update to mastitis protocol and urged to contact outpatient clinic if any concerns or issues develop. Pt has 2 children at home including one year old infant and is familiar with what to expect regarding normative  behavior for age.) Pump: DEBP;Manual (PT reports having both a DEBP and manual pump to utilize at home. Plans to EBF before introducing any pumping or feeding expressed milk.)  Consult Status Consult Status: Complete Date: 03/16/24 (Will call LC line if pt desires latch/feeding assessment to determine causality of nipple pain prior to discharge. Currently reports no bleeding or abrasion and managing tenderness with OTC balm.) Follow-up type: In-patient    Lennie Hummer 03/16/2024, 11:34 AM

## 2024-03-16 NOTE — Progress Notes (Signed)
 Patient discharged. Discharge instructions given. Patient verbalizes understanding. Transported by axillary.

## 2024-03-16 NOTE — Discharge Instructions (Addendum)
 Patient advised to continue to take iron supplements (ferrous sulfate 325 mg PO) on Monday, Wednesday, and Friday until 6wk pp visit.   Discharge instructions Bleeding: Your bleeding could continue up to 6 weeks, the flow should gradually decrease and the color should become dark then lightened over the next couple of weeks. If you notice you are bleeding heavily or passing clots larger than the size of your fist, PLEASE call your physician. No TAMPONS, DOUCHING, ENEMAS OR SEXUAL INTERCOURSE for 6 weeks. Stitches: Shower daily with mild soap and water. Stitches will dissolve over the next couple of weeks, if you experience any discomfort in the vaginal area you may sit in warm water 15-20 minutes, 3-4 times per day. Just enough water to cover vaginal area. AfterPains: This is the uterus contracting back to its normal position and size. Use medications prescribed or recommended by your physician to help relieve this discomfort. Bowels/Hemorrhoids: Drink plenty of water and stay active. Increase fiber, fresh fruits and vegetables in your diet. Rest/Activity: Rest when the baby is resting;  Do not lift > 10 lbs for 6 weeks. No driving for 1-2 weeks. Bathing: Shower daily! Diet: Continue daily prenatal vitamin and iron until your follow up visit to help replenish nutrients and vitamins. If breastfeeding eat extra calories and increase your fluid intake to 12 glasses a day. Contraception: Consult with your provider on what method of birth control you would like to use. Breastfeeding: You may have a slight fever when your milk comes in, but it should go away on its own. If it does not, and rises above 101.0 please call the doctor. Bottlefeeding: wear a snug fitting bra without underwires continuously for 3-5days, avoid any nipple/breast stimulation. If engorgement occurs, take ibuprofen as prescribed and apply fresh green cabbage leaves directly to your breasts inside the bra cups. Postpartum "BLUES": It is  common to emotional days after delivery, however if it persist for greater than 2 weeks or if you feel concerned please let your physician know immediately. This is hormone driven and nothing you can control so please let someone know how you feel. Follow Up Visit: Please schedule a follow up visit with your delivering provider  Call office if you have any of the following: headache, visual changes, fever >101.0 F, chills, breast concerns, excessive vaginal bleeding, incision drainage or problems, leg pain or redness, depression or any other concerns.  For concerns about your baby, please call your pediatrician For breastfeeding concerns, the lactation consultant can be reached at [phone]

## 2024-10-30 ENCOUNTER — Emergency Department

## 2024-10-30 ENCOUNTER — Other Ambulatory Visit: Payer: Self-pay

## 2024-10-30 DIAGNOSIS — O034 Incomplete spontaneous abortion without complication: Secondary | ICD-10-CM | POA: Insufficient documentation

## 2024-10-30 DIAGNOSIS — D62 Acute posthemorrhagic anemia: Secondary | ICD-10-CM | POA: Diagnosis not present

## 2024-10-30 LAB — CBC WITH DIFFERENTIAL/PLATELET
Abs Immature Granulocytes: 0.02 K/uL (ref 0.00–0.07)
Basophils Absolute: 0 K/uL (ref 0.0–0.1)
Basophils Relative: 0 %
Eosinophils Absolute: 0.1 K/uL (ref 0.0–0.5)
Eosinophils Relative: 2 %
HCT: 35.2 % — ABNORMAL LOW (ref 36.0–46.0)
Hemoglobin: 12.6 g/dL (ref 12.0–15.0)
Immature Granulocytes: 0 %
Lymphocytes Relative: 23 %
Lymphs Abs: 2 K/uL (ref 0.7–4.0)
MCH: 29.6 pg (ref 26.0–34.0)
MCHC: 35.8 g/dL (ref 30.0–36.0)
MCV: 82.8 fL (ref 80.0–100.0)
Monocytes Absolute: 0.6 K/uL (ref 0.1–1.0)
Monocytes Relative: 6 %
Neutro Abs: 5.9 K/uL (ref 1.7–7.7)
Neutrophils Relative %: 69 %
Platelets: 285 K/uL (ref 150–400)
RBC: 4.25 MIL/uL (ref 3.87–5.11)
RDW: 12.4 % (ref 11.5–15.5)
WBC: 8.6 K/uL (ref 4.0–10.5)
nRBC: 0 % (ref 0.0–0.2)

## 2024-10-30 LAB — URINALYSIS, ROUTINE W REFLEX MICROSCOPIC
Bacteria, UA: NONE SEEN
Bilirubin Urine: NEGATIVE
Glucose, UA: NEGATIVE mg/dL
Ketones, ur: NEGATIVE mg/dL
Leukocytes,Ua: NEGATIVE
Nitrite: NEGATIVE
Protein, ur: NEGATIVE mg/dL
Specific Gravity, Urine: 1.027 (ref 1.005–1.030)
pH: 6 (ref 5.0–8.0)

## 2024-10-30 LAB — BASIC METABOLIC PANEL WITH GFR
Anion gap: 9 (ref 5–15)
BUN: 14 mg/dL (ref 6–20)
CO2: 23 mmol/L (ref 22–32)
Calcium: 9.2 mg/dL (ref 8.9–10.3)
Chloride: 104 mmol/L (ref 98–111)
Creatinine, Ser: 0.34 mg/dL — ABNORMAL LOW (ref 0.44–1.00)
GFR, Estimated: >60 mL/min (ref >60)
Glucose, Bld: 98 mg/dL (ref 70–99)
Potassium: 3.7 mmol/L (ref 3.5–5.1)
Sodium: 136 mmol/L (ref 135–145)

## 2024-10-30 LAB — HCG, QUANTITATIVE, PREGNANCY: hCG, Beta Chain, Quant, S: 5122 m[IU]/mL — ABNORMAL HIGH (ref <5)

## 2024-10-30 LAB — POC URINE PREG, ED: Preg Test, Ur: POSITIVE — AB

## 2024-10-30 NOTE — ED Triage Notes (Signed)
 Pt reports she is aprox [redacted] weeks pregnant and began to have vaginal bleeding earlier today and pt is having lower abd cramping. G4P3. Pt goes to [facility] for care

## 2024-10-31 ENCOUNTER — Observation Stay
Admission: EM | Admit: 2024-10-31 | Discharge: 2024-11-01 | Disposition: A | Discharge status: Home / Self Care | Attending: Obstetrics and Gynecology | Admitting: Obstetrics and Gynecology

## 2024-10-31 ENCOUNTER — Encounter: Payer: Self-pay | Admitting: Radiology

## 2024-10-31 ENCOUNTER — Other Ambulatory Visit: Payer: Self-pay

## 2024-10-31 ENCOUNTER — Encounter
Admission: EM | Disposition: A | Discharge status: Home / Self Care | Payer: Self-pay | Source: Home / Self Care | Attending: Emergency Medicine

## 2024-10-31 ENCOUNTER — Emergency Department
Admission: EM | Admit: 2024-10-31 | Discharge: 2024-10-31 | Disposition: A | Discharge status: Home / Self Care | Source: Home / Self Care | Attending: Emergency Medicine | Admitting: Emergency Medicine

## 2024-10-31 DIAGNOSIS — O039 Complete or unspecified spontaneous abortion without complication: Principal | ICD-10-CM

## 2024-10-31 DIAGNOSIS — O034 Incomplete spontaneous abortion without complication: Secondary | ICD-10-CM

## 2024-10-31 DIAGNOSIS — D62 Acute posthemorrhagic anemia: Secondary | ICD-10-CM | POA: Insufficient documentation

## 2024-10-31 DIAGNOSIS — O4690 Antepartum hemorrhage, unspecified, unspecified trimester: Secondary | ICD-10-CM

## 2024-10-31 HISTORY — PX: DILATION AND EVACUATION: SHX1459

## 2024-10-31 LAB — CBC WITH DIFFERENTIAL/PLATELET
Abs Immature Granulocytes: 0.05 K/uL (ref 0.00–0.07)
Basophils Absolute: 0 K/uL (ref 0.0–0.1)
Basophils Relative: 0 %
Eosinophils Absolute: 0.1 K/uL (ref 0.0–0.5)
Eosinophils Relative: 1 %
HCT: 30.7 % — ABNORMAL LOW (ref 36.0–46.0)
Hemoglobin: 11.2 g/dL — ABNORMAL LOW (ref 12.0–15.0)
Immature Granulocytes: 1 %
Lymphocytes Relative: 15 %
Lymphs Abs: 1.6 K/uL (ref 0.7–4.0)
MCH: 29.9 pg (ref 26.0–34.0)
MCHC: 36.5 g/dL — ABNORMAL HIGH (ref 30.0–36.0)
MCV: 81.9 fL (ref 80.0–100.0)
Monocytes Absolute: 0.6 K/uL (ref 0.1–1.0)
Monocytes Relative: 6 %
Neutro Abs: 8.2 K/uL — ABNORMAL HIGH (ref 1.7–7.7)
Neutrophils Relative %: 77 %
Platelets: 298 K/uL (ref 150–400)
RBC: 3.75 MIL/uL — ABNORMAL LOW (ref 3.87–5.11)
RDW: 12.5 % (ref 11.5–15.5)
WBC: 10.6 K/uL — ABNORMAL HIGH (ref 4.0–10.5)
nRBC: 0 % (ref 0.0–0.2)

## 2024-10-31 LAB — HCG, QUANTITATIVE, PREGNANCY: hCG, Beta Chain, Quant, S: 3683 m[IU]/mL — ABNORMAL HIGH (ref <5)

## 2024-10-31 LAB — PREPARE RBC (CROSSMATCH)

## 2024-10-31 LAB — COMPREHENSIVE METABOLIC PANEL WITH GFR
ALT: 11 U/L (ref 0–44)
AST: 14 U/L — ABNORMAL LOW (ref 15–41)
Albumin: 3.7 g/dL (ref 3.5–5.0)
Alkaline Phosphatase: 57 U/L (ref 38–126)
Anion gap: 9 (ref 5–15)
BUN: 12 mg/dL (ref 6–20)
CO2: 22 mmol/L (ref 22–32)
Calcium: 8.9 mg/dL (ref 8.9–10.3)
Chloride: 104 mmol/L (ref 98–111)
Creatinine, Ser: 0.5 mg/dL (ref 0.44–1.00)
GFR, Estimated: >60 mL/min (ref >60)
Glucose, Bld: 129 mg/dL — ABNORMAL HIGH (ref 70–99)
Potassium: 3.4 mmol/L — ABNORMAL LOW (ref 3.5–5.1)
Sodium: 135 mmol/L (ref 135–145)
Total Bilirubin: 0.6 mg/dL (ref 0.0–1.2)
Total Protein: 6.9 g/dL (ref 6.5–8.1)

## 2024-10-31 SURGERY — DILATION AND EVACUATION, UTERUS
Anesthesia: General | Site: Vagina

## 2024-10-31 MED ORDER — LACTATED RINGERS IV BOLUS
1000.0000 mL | Freq: Once | INTRAVENOUS | Status: AC
  Administered 2024-10-31: 1000 mL via INTRAVENOUS

## 2024-10-31 MED ORDER — ONDANSETRON HCL 4 MG/2ML IJ SOLN
4.0000 mg | Freq: Once | INTRAMUSCULAR | Status: AC
  Administered 2024-10-31: 4 mg via INTRAVENOUS
  Filled 2024-10-31: qty 2

## 2024-10-31 MED ORDER — SODIUM CHLORIDE 0.9 % IV SOLN: 10.0000 mL/h | Freq: Once | INTRAVENOUS | Status: DC

## 2024-10-31 MED ORDER — VASOPRESSIN 20 UNIT/ML IV SOLN
INTRAVENOUS | Status: AC
  Filled 2024-10-31: qty 1

## 2024-10-31 MED ORDER — FENTANYL CITRATE (PF) 100 MCG/2ML IJ SOLN
INTRAMUSCULAR | Status: AC
  Filled 2024-10-31: qty 2

## 2024-10-31 MED ORDER — TRANEXAMIC ACID-NACL 1000-0.7 MG/100ML-% IV SOLN
1000.0000 mg | Freq: Once | INTRAVENOUS | Status: AC
  Administered 2024-10-31: 1000 mg via INTRAVENOUS
  Filled 2024-10-31: qty 100

## 2024-10-31 MED ORDER — MORPHINE SULFATE (PF) 2 MG/ML IV SOLN
2.0000 mg | Freq: Once | INTRAVENOUS | Status: AC
  Administered 2024-10-31: 2 mg via INTRAVENOUS
  Filled 2024-10-31: qty 1

## 2024-10-31 MED ORDER — PROPOFOL 10 MG/ML IV BOLUS
INTRAVENOUS | Status: AC
  Filled 2024-10-31: qty 20

## 2024-10-31 MED ORDER — SILVER NITRATE-POT NITRATE 75-25 % EX MISC
CUTANEOUS | Status: AC
  Filled 2024-10-31: qty 10

## 2024-10-31 MED ORDER — MIDAZOLAM HCL 2 MG/2ML IJ SOLN
INTRAMUSCULAR | Status: AC
  Filled 2024-10-31: qty 2

## 2024-10-31 MED ORDER — METHYLERGONOVINE MALEATE 0.2 MG/ML IJ SOLN
INTRAMUSCULAR | Status: AC
  Filled 2024-10-31: qty 1

## 2024-10-31 SURGICAL SUPPLY — 27 items
DRSG TELFA 3X8 NADH STRL (GAUZE/BANDAGES/DRESSINGS) IMPLANT
FILTER UTR ASPR SPEC (MISCELLANEOUS) ×1 IMPLANT
GAUZE 4X4 16PLY ~~LOC~~+RFID DBL (SPONGE) IMPLANT
GLOVE BIO SURGEON STRL SZ7 (GLOVE) ×1 IMPLANT
GLOVE BIOGEL PI IND STRL 7.5 (GLOVE) ×1 IMPLANT
GOWN STRL REUS W/ TWL LRG LVL3 (GOWN DISPOSABLE) ×2 IMPLANT
HANDLE YANKAUER SUCT BULB TIP (MISCELLANEOUS) IMPLANT
KIT BERKELEY 1ST TRIMESTER 3/8 (MISCELLANEOUS) ×1 IMPLANT
KIT TURNOVER CYSTO (KITS) ×1 IMPLANT
MANIFOLD NEPTUNE II (INSTRUMENTS) ×1 IMPLANT
NDL HYPO 22X1.5 SAFETY MO (MISCELLANEOUS) IMPLANT
NDL SPNL 22GX3.5 QUINCKE BK (NEEDLE) IMPLANT
NEEDLE HYPO 22X1.5 SAFETY MO (MISCELLANEOUS) ×1 IMPLANT
NEEDLE SPNL 22GX3.5 QUINCKE BK (NEEDLE) ×1 IMPLANT
PACK DNC HYST (MISCELLANEOUS) ×1 IMPLANT
PAD OB MATERNITY 11 LF (PERSONAL CARE ITEMS) ×1 IMPLANT
PAD PREP OB/GYN DISP 24X41 (PERSONAL CARE ITEMS) ×1 IMPLANT
SCRUB CHG 4% DYNA-HEX 4OZ (MISCELLANEOUS) ×1 IMPLANT
SET BERKELEY SUCTION TUBING (SUCTIONS) ×1 IMPLANT
SET CYSTO IRRIGATION (SET/KITS/TRAYS/PACK) IMPLANT
SOLUTION PREP PVP 2OZ (MISCELLANEOUS) ×1 IMPLANT
SYR 10ML LL (SYRINGE) IMPLANT
TOWEL OR 17X26 4PK STRL BLUE (TOWEL DISPOSABLE) ×1 IMPLANT
TRAP FLUID SMOKE EVACUATOR (MISCELLANEOUS) ×1 IMPLANT
VACURETTE 12 RIGID CVD (CANNULA) IMPLANT
VACURETTE 8 RIGID CVD (CANNULA) IMPLANT
WATER STERILE IRR 500ML POUR (IV SOLUTION) ×1 IMPLANT

## 2024-10-31 NOTE — ED Notes (Signed)
 Pt's chux pads were changed, new brief provided, peri care provided

## 2024-10-31 NOTE — Discharge Instructions (Signed)
 As we discussed, unfortunately your ultrasound showed that your baby stopped developing normally and you have what is diagnosed as a failed pregnancy.  This will lead to what is known as an inevitable abortion, or miscarriage.  Usually this happens naturally, but when you follow-up on Wednesday with your OB/GYN provider, you can talk about the results and other options that may be available to you.  They will most likely repeat the ultrasound as well.  In the meantime you may experience more bleeding, cramping, and even the passage of blood clots or tissue.  Again, this is normal, but if you develop new or worsening symptoms that concern you, please return to the emergency department or to your OB/GYN clinic.

## 2024-10-31 NOTE — ED Provider Notes (Signed)
 [facility] Provider Note    Event Date/Time   First MD Initiated Contact with Patient 10/31/24 0006     (approximate)   History   Vaginal Bleeding   HPI Donna Reeves is a 28 y.o. female G4, P3 at approximately [redacted] weeks gestation and goes to [place] [facility].  She presents for evaluation of acute onset vaginal spotting of bright red blood.  She said that typically she and her husband do not have sex while she is pregnant, but they did last night.  Today she was surprised to discover some blood when she wiped after urinating, and thought maybe it was just a one-time thing, but then she discovered a little bit of bright red blood in her underwear and she had a little bit more when she wiped again.  After that, she started to have some lower abdominal cramping.     Physical Exam   Triage Vital Signs: ED Triage Vitals  Encounter Vitals Group     BP 10/30/24 2122 125/87     Girls Systolic BP Percentile --      Girls Diastolic BP Percentile --      Boys Systolic BP Percentile --      Boys Diastolic BP Percentile --      Pulse Rate 10/30/24 2122 68     Resp 10/30/24 2122 17     Temp 10/30/24 2122 99 F (37.2 C)     Temp src --      SpO2 10/30/24 2122 100 %     Weight 10/30/24 2120 74.8 kg (165 lb)     Height 10/30/24 2120 1.6 m (5' 3)     Head Circumference --      Peak Flow --      Pain Score 10/30/24 2120 5     Pain Loc --      Pain Education --      Exclude from Growth Chart --     Most recent vital signs: Vitals:   10/30/24 2122 10/31/24 0036  BP: 125/87   Pulse: 68   Resp: 17 17  Temp: 99 F (37.2 C)   SpO2: 100%     General: Awake, no obvious distress, pleasant and conversant. CV:  Good peripheral perfusion.  Resp:  Normal effort. Speaking easily and comfortably, no accessory muscle usage nor intercostal retractions.   Abd:  No distention.  Other:  Deferred  GYN exam.   ED Results / Procedures / Treatments    Labs (all labs ordered are listed, but only abnormal results are displayed) Labs Reviewed  CBC WITH DIFFERENTIAL/PLATELET - Abnormal; Notable for the following components:      Result Value   HCT 35.2 (*)    All other components within normal limits  BASIC METABOLIC PANEL WITH GFR - Abnormal; Notable for the following components:   Creatinine, Ser 0.34 (*)    All other components within normal limits  URINALYSIS, ROUTINE W REFLEX MICROSCOPIC - Abnormal; Notable for the following components:   Color, Urine YELLOW (*)    APPearance HAZY (*)    Hgb urine dipstick MODERATE (*)    All other components within normal limits  HCG, QUANTITATIVE, PREGNANCY - Abnormal; Notable for the following components:   hCG, Beta Chain, Quant, S 5,122 (*)    All other components within normal limits  POC URINE PREG, ED - Abnormal; Notable for the following components:   Preg Test, Ur POSITIVE (*)    All other components  within normal limits      RADIOLOGY See ED course for details   PROCEDURES:  Critical Care performed: No  Procedures    IMPRESSION / MDM / ASSESSMENT AND PLAN / ED COURSE  I reviewed the triage vital signs and the nursing notes.                              Differential diagnosis includes, but is not limited to, threatened miscarriage, incomplete miscarriage, normal bleeding from an early trimester pregnancy, ectopic pregnancy, , blighted ovum, vaginal/cervical trauma, subchorionic hemorrhage/hematoma, etc.   Patient's presentation is most consistent with acute presentation with potential threat to life or bodily function.  Labs/studies ordered: BMP, CBC w/ diff, HCG, U-preg, UA  Interventions/Medications given:  Medications - No data to display  (Note:  hospital course my include additional interventions and/or labs/studies not listed above.)   I checked the medical record and the patient is Rh+.  Her labs are all reassuring other than hCG seems a bit low at 5122  which is unexpected at 12 weeks.  She said that she previously had and outpatient ultrasound about a month ago.  Will confirm viability of pregnancy with ultrasound and reassess  Clinical Course as of 10/31/24 0641  Tue Oct 31, 2024  0058 US  OB LESS THAN 14 WEEKS WITH OB TRANSVAGINAL I independently viewed and interpreted the patient's ultrasound and unfortunately I cannot appreciate fetal heart tones.  The radiologist confirmed lack of fetal heart tones, arrested development of gestational sac and fetus at between 8 and 10 weeks, and determined that the gestation meets criteria for a failed pregnancy.  I had my usual and customary febrile pregnancy/miscarriage discussion with the patient.  She will follow-up as scheduled in 1 to 2 days with her OB/GYN provider.  I gave my usual follow-up recommendations and return precautions. [CF]    Clinical Course User Index [CF] Gordan Huxley, MD     FINAL CLINICAL IMPRESSION(S) / ED DIAGNOSES   Final diagnoses:  Incomplete inevitable abortion     Rx / DC Orders   ED Discharge Orders     None        Note:  This document was prepared using Dragon voice recognition software and may include unintentional dictation errors.   Gordan Huxley, MD 10/31/24 [phone]

## 2024-10-31 NOTE — ED Triage Notes (Signed)
 Pt via POV for vaginal bleeding. Was dx last night with miscarriage @ 10 weeks, P3G4. Pt c/o dizziness, near syncope. Bleeding started @ 1830 today.

## 2024-10-31 NOTE — ED Provider Notes (Signed)
 [facility] Provider Note    Event Date/Time   First MD Initiated Contact with Patient 10/31/24 2038     (approximate)   History   Chief Complaint Vaginal Bleeding   HPI  Vonette Grosso Awbrey is a 28 y.o. female G4P3003 at approximately 12 weeks of pregnancy who presents to the ED complaining of vaginal bleeding.  Patient reports that she was seen in the ED last night for light vaginal bleeding, had an ultrasound at that time that was concerning for inevitable miscarriage.  She was discharged home with plan for outpatient follow-up and states that she was doing well until a couple of hours prior to arrival, when she developed more significant bleeding.  She has not been wearing a pad since onset of bleeding, but describes passing multiple large clots along with severe crampy pain in her lower abdomen.  She has not passed anything other than blood.      Physical Exam   Triage Vital Signs: ED Triage Vitals  Encounter Vitals Group     BP      Girls Systolic BP Percentile      Girls Diastolic BP Percentile      Boys Systolic BP Percentile      Boys Diastolic BP Percentile      Pulse      Resp      Temp      Temp src      SpO2      Weight      Height      Head Circumference      Peak Flow      Pain Score      Pain Loc      Pain Education      Exclude from Growth Chart     Most recent vital signs: Vitals:   10/31/24 2230 10/31/24 2330  BP: 103/60 (!) 81/53  Pulse: 67 77  Resp: 15 12  Temp:    SpO2: 97% 98%    Constitutional: Alert and oriented. Eyes: Conjunctivae are normal. Head: Atraumatic. Nose: No congestion/rhinnorhea. Mouth/Throat: Mucous membranes are moist.  Cardiovascular: Normal rate, regular rhythm. Grossly normal heart sounds.  2+ radial pulses bilaterally. Respiratory: Normal respiratory effort.  No retractions. Lungs CTAB. Gastrointestinal: Soft and nontender. No distention. Genitourinary: 2 large clots noted on  patient's pad, active bleeding noted with speculum exam. Musculoskeletal: No lower extremity tenderness nor edema.  Neurologic:  Normal speech and language. No gross focal neurologic deficits are appreciated.    ED Results / Procedures / Treatments   Labs (all labs ordered are listed, but only abnormal results are displayed) Labs Reviewed  CBC WITH DIFFERENTIAL/PLATELET - Abnormal; Notable for the following components:      Result Value   WBC 10.6 (*)    RBC 3.75 (*)    Hemoglobin 11.2 (*)    HCT 30.7 (*)    MCHC 36.5 (*)    Neutro Abs 8.2 (*)    All other components within normal limits  COMPREHENSIVE METABOLIC PANEL WITH GFR - Abnormal; Notable for the following components:   Potassium 3.4 (*)    Glucose, Bld 129 (*)    AST 14 (*)    All other components within normal limits  HCG, QUANTITATIVE, PREGNANCY - Abnormal; Notable for the following components:   hCG, Beta Chain, Quant, S 3,683 (*)    All other components within normal limits  HEMOGLOBIN AND HEMATOCRIT, BLOOD  TYPE AND SCREEN  PREPARE RBC (CROSSMATCH)  PROCEDURES:  Critical Care performed: Yes, see critical care procedure note(s)  .Critical Care  Performed by: Willo Dunnings, MD Authorized by: Willo Dunnings, MD   Critical care provider statement:    Critical care time (minutes):  30   Critical care time was exclusive of:  Separately billable procedures and treating other patients and teaching time   Critical care was necessary to treat or prevent imminent or life-threatening deterioration of the following conditions:  Shock   Critical care was time spent personally by me on the following activities:  Development of treatment plan with patient or surrogate, discussions with consultants, evaluation of patient's response to treatment, examination of patient, ordering and review of laboratory studies, ordering and review of radiographic studies, ordering and performing treatments and interventions, pulse  oximetry, re-evaluation of patient's condition and review of old charts   I assumed direction of critical care for this patient from another provider in my specialty: no     Care discussed with: admitting provider      MEDICATIONS ORDERED IN ED: Medications  0.9 %  sodium chloride  infusion (has no administration in time range)  lactated ringers  bolus 1,000 mL (0 mLs Intravenous Stopped 10/31/24 2317)  ondansetron  (ZOFRAN ) injection 4 mg (4 mg Intravenous Given 10/31/24 2124)  morphine (PF) 2 MG/ML injection 2 mg (2 mg Intravenous Given 10/31/24 2209)  tranexamic acid (CYKLOKAPRON) IVPB 1,000 mg (1,000 mg Intravenous New Bag/Given 10/31/24 2325)  ondansetron  (ZOFRAN ) injection 4 mg (4 mg Intravenous Given 10/31/24 2340)     IMPRESSION / MDM / ASSESSMENT AND PLAN / ED COURSE  I reviewed the triage vital signs and the nursing notes.                              28 y.o. female [phone] at approximately 12 weeks of pregnancy who presents to the ED complaining of significant vaginal bleeding and pelvic pain after being diagnosed with inevitable miscarriage yesterday.  Patient's presentation is most consistent with acute presentation with potential threat to life or bodily function.  Differential diagnosis includes, but is not limited to, inevitable miscarriage, completed miscarriage, anemia, electrolyte abnormality, AKI.  Patient uncomfortable appearing but in no acute distress, vital signs are unremarkable.  Significant bleeding noted with pelvic examination and patient did appear to have a vasovagal episode with dizziness, lightheadedness, and drop in blood pressure.  Blood pressure improved with IV fluids, labs show drop in hemoglobin of about 1.5 since early this morning.  Beta-hCG slightly downtrending, no significant electrolyte abnormality or AKI noted.  Patient developed nausea and vomiting along with pain, was given IV morphine and Zofran  with improvement.  Given significant ongoing  bleeding, I did reach out to OB/GYN team.  Delon Coe recommends reaching out to Dr. Verdon, currently awaiting callback.  Patient given IV TXA for ongoing bleeding and case discussed with Dr. Verdon of OB/GYN, who will arrange to have patient taken for D&C.  Patient did develop low BP with ongoing bleeding, most recent MAP of 70.  We will proceed with blood transfusion, patient headed to the OR at this time.      FINAL CLINICAL IMPRESSION(S) / ED DIAGNOSES   Final diagnoses:  Inevitable complete miscarriage without complication  Vaginal bleeding in pregnancy     Rx / DC Orders   ED Discharge Orders     None        Note:  This document was prepared using Dragon voice recognition  software and may include unintentional dictation errors.   Willo Dunnings, MD 10/31/24 [phone]

## 2024-11-01 ENCOUNTER — Encounter: Payer: Self-pay | Admitting: Obstetrics and Gynecology

## 2024-11-01 ENCOUNTER — Emergency Department: Admitting: General Practice

## 2024-11-01 DIAGNOSIS — O034 Incomplete spontaneous abortion without complication: Secondary | ICD-10-CM | POA: Diagnosis present

## 2024-11-01 LAB — CBC
HCT: 26.8 % — ABNORMAL LOW (ref 36.0–46.0)
Hemoglobin: 9.8 g/dL — ABNORMAL LOW (ref 12.0–15.0)
MCH: 29.5 pg (ref 26.0–34.0)
MCHC: 36.6 g/dL — ABNORMAL HIGH (ref 30.0–36.0)
MCV: 80.7 fL (ref 80.0–100.0)
Platelets: 223 K/uL (ref 150–400)
RBC: 3.32 MIL/uL — ABNORMAL LOW (ref 3.87–5.11)
RDW: 13 % (ref 11.5–15.5)
WBC: 9.3 K/uL (ref 4.0–10.5)
nRBC: 0 % (ref 0.0–0.2)

## 2024-11-01 LAB — HEMOGLOBIN AND HEMATOCRIT, BLOOD
HCT: 25.2 % — ABNORMAL LOW (ref 36.0–46.0)
HCT: 31.9 % — ABNORMAL LOW (ref 36.0–46.0)
Hemoglobin: 11.1 g/dL — ABNORMAL LOW (ref 12.0–15.0)
Hemoglobin: 8.8 g/dL — ABNORMAL LOW (ref 12.0–15.0)

## 2024-11-01 MED ORDER — OXYCODONE HCL 5 MG/5ML PO SOLN: 5.0000 mg | Freq: Once | ORAL | Status: DC | PRN

## 2024-11-01 MED ORDER — ALBUTEROL SULFATE (2.5 MG/3ML) 0.083% IN NEBU: 2.5000 mg | INHALATION_SOLUTION | Freq: Once | RESPIRATORY_TRACT | Status: DC | PRN

## 2024-11-01 MED ORDER — HYDROMORPHONE HCL 1 MG/ML IJ SOLN
0.5000 mg | INTRAMUSCULAR | Status: DC | PRN
  Administered 2024-11-01: 0.5 mg via INTRAVENOUS
  Filled 2024-11-01: qty 0.5

## 2024-11-01 MED ORDER — DOCUSATE SODIUM 100 MG PO CAPS
100.0000 mg | ORAL_CAPSULE | Freq: Two times a day (BID) | ORAL | Status: DC
  Administered 2024-11-01: 100 mg via ORAL
  Filled 2024-11-01: qty 1

## 2024-11-01 MED ORDER — SODIUM CHLORIDE 0.9 % IV SOLN
100.0000 mg | Freq: Once | INTRAVENOUS | Status: DC
  Administered 2024-11-01: 100 mg via INTRAVENOUS
  Filled 2024-11-01: qty 100

## 2024-11-01 MED ORDER — 0.9 % SODIUM CHLORIDE (POUR BTL) OPTIME
TOPICAL | Status: DC | PRN
  Administered 2024-11-01: 5 mL

## 2024-11-01 MED ORDER — DIPHENHYDRAMINE HCL 50 MG/ML IJ SOLN: 25.0000 mg | Freq: Once | INTRAMUSCULAR | Status: DC | PRN

## 2024-11-01 MED ORDER — SODIUM CHLORIDE 0.9% IV SOLUTION: Freq: Once | INTRAVENOUS | Status: DC

## 2024-11-01 MED ORDER — IBUPROFEN 600 MG PO TABS
600.0000 mg | ORAL_TABLET | Freq: Four times a day (QID) | ORAL | Status: DC
  Filled 2024-11-01: qty 1

## 2024-11-01 MED ORDER — ONDANSETRON HCL 4 MG/2ML IJ SOLN: 4.0000 mg | Freq: Once | INTRAMUSCULAR | Status: DC | PRN

## 2024-11-01 MED ORDER — PHENYLEPHRINE 80 MCG/ML (10ML) SYRINGE FOR IV PUSH (FOR BLOOD PRESSURE SUPPORT)
PREFILLED_SYRINGE | INTRAVENOUS | Status: DC | PRN
  Administered 2024-11-01: 120 ug via INTRAVENOUS

## 2024-11-01 MED ORDER — ONDANSETRON HCL 4 MG PO TABS
4.0000 mg | ORAL_TABLET | Freq: Four times a day (QID) | ORAL | Status: DC | PRN
  Administered 2024-11-01: 4 mg via ORAL
  Filled 2024-11-01: qty 1

## 2024-11-01 MED ORDER — KETOROLAC TROMETHAMINE 30 MG/ML IJ SOLN
30.0000 mg | Freq: Four times a day (QID) | INTRAMUSCULAR | Status: DC
  Administered 2024-11-01 (×2): 30 mg via INTRAVENOUS
  Filled 2024-11-01: qty 1

## 2024-11-01 MED ORDER — IRON SUCROSE 300 MG IVPB - SIMPLE MED
300.0000 mg | Freq: Once | Status: AC
  Administered 2024-11-01: 300 mg via INTRAVENOUS
  Filled 2024-11-01: qty 300

## 2024-11-01 MED ORDER — LACTATED RINGERS IV SOLN
INTRAVENOUS | Status: DC | PRN
Start: 2024-11-01 — End: 2024-11-01

## 2024-11-01 MED ORDER — SIMETHICONE 80 MG PO CHEW: 80.0000 mg | CHEWABLE_TABLET | Freq: Four times a day (QID) | ORAL | Status: DC | PRN

## 2024-11-01 MED ORDER — SENNOSIDES-DOCUSATE SODIUM 8.6-50 MG PO TABS
2.0000 | ORAL_TABLET | Freq: Every day | ORAL | Status: DC
  Administered 2024-11-01: 2 via ORAL
  Filled 2024-11-01: qty 2

## 2024-11-01 MED ORDER — SENNOSIDES-DOCUSATE SODIUM 8.6-50 MG PO TABS: 1.0000 | ORAL_TABLET | Freq: Every evening | ORAL | Status: DC | PRN

## 2024-11-01 MED ORDER — FENTANYL CITRATE (PF) 100 MCG/2ML IJ SOLN
25.0000 ug | INTRAMUSCULAR | Status: DC | PRN
  Administered 2024-11-01 (×3): 50 ug via INTRAVENOUS

## 2024-11-01 MED ORDER — SODIUM CHLORIDE 0.9 % IV SOLN: INTRAVENOUS | Status: DC | PRN

## 2024-11-01 MED ORDER — LIDOCAINE HCL (CARDIAC) PF 100 MG/5ML IV SOSY
PREFILLED_SYRINGE | INTRAVENOUS | Status: DC | PRN
  Administered 2024-11-01: 80 mg via INTRAVENOUS

## 2024-11-01 MED ORDER — OXYCODONE HCL 5 MG PO TABS: 5.0000 mg | ORAL_TABLET | ORAL | Status: DC | PRN

## 2024-11-01 MED ORDER — PROPOFOL 10 MG/ML IV BOLUS
INTRAVENOUS | Status: DC | PRN
  Administered 2024-11-01: 150 mg via INTRAVENOUS

## 2024-11-01 MED ORDER — EPINEPHRINE 0.3 MG/0.3ML IJ SOAJ: 0.3000 mg | Freq: Once | INTRAMUSCULAR | Status: DC | PRN

## 2024-11-01 MED ORDER — FENTANYL CITRATE (PF) 100 MCG/2ML IJ SOLN: 25.0000 ug | INTRAMUSCULAR | Status: DC | PRN

## 2024-11-01 MED ORDER — PRENATAL MULTIVITAMIN CH
1.0000 | ORAL_TABLET | Freq: Every day | ORAL | Status: DC
  Administered 2024-11-01: 1 via ORAL
  Filled 2024-11-01: qty 1

## 2024-11-01 MED ORDER — ACETAMINOPHEN 325 MG PO TABS: 650.0000 mg | ORAL_TABLET | ORAL | Status: DC | PRN

## 2024-11-01 MED ORDER — OXYCODONE HCL 5 MG/5ML PO SOLN: 5.0000 mg | Freq: Once | ORAL | Status: AC | PRN

## 2024-11-01 MED ORDER — METHYLPREDNISOLONE SODIUM SUCC 125 MG IJ SOLR: 125.0000 mg | Freq: Once | INTRAMUSCULAR | Status: DC | PRN

## 2024-11-01 MED ORDER — HYDROMORPHONE HCL 1 MG/ML IJ SOLN: 0.5000 mg | INTRAMUSCULAR | Status: DC

## 2024-11-01 MED ORDER — SUCCINYLCHOLINE CHLORIDE 200 MG/10ML IV SOSY
PREFILLED_SYRINGE | INTRAVENOUS | Status: DC | PRN
  Administered 2024-11-01: 100 mg via INTRAVENOUS

## 2024-11-01 MED ORDER — SODIUM CHLORIDE 0.9 % IV BOLUS: 500.0000 mL | Freq: Once | INTRAVENOUS | Status: DC | PRN

## 2024-11-01 MED ORDER — VASOPRESSIN 20 UNIT/ML IV SOLN
INTRAVENOUS | Status: DC | PRN
  Administered 2024-11-01: 2 [IU] via INTRAVENOUS

## 2024-11-01 MED ORDER — FENTANYL CITRATE (PF) 100 MCG/2ML IJ SOLN
INTRAMUSCULAR | Status: AC
  Filled 2024-11-01: qty 2

## 2024-11-01 MED ORDER — KETOROLAC TROMETHAMINE 30 MG/ML IJ SOLN
INTRAMUSCULAR | Status: AC
  Filled 2024-11-01: qty 1

## 2024-11-01 MED ORDER — ALUM & MAG HYDROXIDE-SIMETH 200-200-20 MG/5ML PO SUSP: 30.0000 mL | ORAL | Status: DC | PRN

## 2024-11-01 MED ORDER — LACTATED RINGERS IV SOLN: INTRAVENOUS | Status: DC

## 2024-11-01 MED ORDER — GABAPENTIN 100 MG PO CAPS: 100.0000 mg | ORAL_CAPSULE | Freq: Every day | ORAL | Status: DC

## 2024-11-01 MED ORDER — ONDANSETRON HCL 4 MG/2ML IJ SOLN
4.0000 mg | Freq: Four times a day (QID) | INTRAMUSCULAR | Status: DC | PRN
Start: 2024-11-01 — End: 2024-11-01
  Administered 2024-11-01: 4 mg via INTRAVENOUS
  Filled 2024-11-01: qty 2

## 2024-11-01 MED ORDER — VASOPRESSIN 20 UNIT/ML IV SOLN
INTRAVENOUS | Status: AC
  Filled 2024-11-01: qty 2

## 2024-11-01 MED ORDER — OXYCODONE HCL 5 MG PO TABS: 5.0000 mg | ORAL_TABLET | Freq: Once | ORAL | Status: DC | PRN

## 2024-11-01 MED ORDER — ONDANSETRON HCL 4 MG/2ML IJ SOLN
INTRAMUSCULAR | Status: DC | PRN
  Administered 2024-11-01: 4 mg via INTRAVENOUS

## 2024-11-01 MED ORDER — OXYCODONE HCL 5 MG PO TABS
5.0000 mg | ORAL_TABLET | Freq: Once | ORAL | Status: AC | PRN
  Administered 2024-11-01: 5 mg via ORAL

## 2024-11-01 MED ORDER — MIDAZOLAM HCL (PF) 2 MG/2ML IJ SOLN
INTRAMUSCULAR | Status: DC | PRN
  Administered 2024-11-01: 2 mg via INTRAVENOUS

## 2024-11-01 MED ORDER — MENTHOL 3 MG MT LOZG: 1.0000 | LOZENGE | OROMUCOSAL | Status: DC | PRN

## 2024-11-01 MED ORDER — VASOPRESSIN 20 UNIT/ML IV SOLN
INTRAVENOUS | Status: DC | PRN
  Administered 2024-11-01: 20 [IU] via INTRAMUSCULAR

## 2024-11-01 MED ORDER — FENTANYL CITRATE (PF) 100 MCG/2ML IJ SOLN
INTRAMUSCULAR | Status: DC | PRN
  Administered 2024-11-01: 50 ug via INTRAVENOUS

## 2024-11-01 MED ORDER — OXYCODONE HCL 5 MG PO TABS
ORAL_TABLET | ORAL | Status: AC
  Filled 2024-11-01: qty 1

## 2024-11-01 NOTE — Discharge Instructions (Signed)
Discharge instructions after a Dilation and Curettage ° °Signs and Symptoms to Report ° °Call our office at [phone] if you have any of the following:  ° °• Fever over 100.4 degrees or higher °• Severe stomach pain not relieved with pain medications °• Bright red bleeding that’s heavier than a period that does not slow with rest after the first 24 hours °• To go the bathroom a lot (frequency), you can’t hold your urine (urgency), or it hurts when you empty your bladder (urinate) °• Chest pain °• Shortness of breath °• Pain in the calves of your legs °• Severe nausea and vomiting not relieved with anti-nausea medications °• Any concerns ° °What You Can Expect after Surgery °• You may see some pink tinged, bloody fluid. This is normal. You may also have cramping for several days.  ° °Activities after Your Discharge °Follow these guidelines to help speed your recovery at home: °• Don’t drive if you are in pain or taking narcotic pain medicine. You may drive when you can safely slam on the brakes, turn the wheel forcefully, and rotate your torso comfortably. This is typically 4-7 days. Practice in a parking lot or side street prior to attempting to drive regularly.  °• Ask others to help with household chores until you feel up to doing tasks. °• Don’t do strenuous activities, exercises, or sports like vacuuming, tennis, squash, etc. until your doctor says it is safe to do so. °• Walk as you feel able. Rest often since it may take a week or two for your energy level to return to normal.  °• You may climb stairs °• Avoid constipation: °  -Eat fruits, vegetables, and whole grains. Eat small meals as your appetite will take time to return to normal. °  -Drink 6 to 8 glasses of water each day unless your doctor has told you to limit your fluids. °  -Use a laxative or stool softener as needed if constipation becomes a problem. You may take Miralax, metamucil, Citrucil, Colace, Senekot, FiberCon, etc. If this does not  relieve the constipation, try two tablespoons of Milk Of Magnesia every 8 hours until your bowels move.  °• You may shower.  °• Do not get in a hot tub, swimming pool, etc. until your doctor agrees. °• Do not douche, use tampons, or have sex until you stop spotting, usually about 2 weeks. °• Take your pain medicine when you need it. The medicine may not work as well if the pain is bad. ° °Take the medicines you were taking before surgery. Other medications you might need are pain medications (ibuprofen), medications for constipation (Colace) and nausea medications (Zofran).  ° ° ° ° ° °Coping with Pregnancy Loss °Pregnancy loss can happen any time during a pregnancy. Often the cause is not known. It is rarely because of anything you did. Pregnancy loss in early pregnancy (during the first trimester) is called a miscarriage. This type of pregnancy loss is the most common.  ° °Any pregnancy loss can be devastating. You will need to recover both physically and emotionally. Most women are able to get pregnant again after a pregnancy loss and deliver a healthy baby. ° °How to manage emotional recovery ° °Pregnancy loss is very hard emotionally. You may feel many different emotions while you grieve. You may feel sad and angry. You may also feel guilty. It is normal to have periods of crying. Emotional recovery can take longer than physical recovery. It is different for everyone.   Some women may feel back to normal quickly and others take longer. °Taking these steps can help you cope: °· Remember that it is unlikely you did anything to cause the pregnancy loss. °· Share your thoughts and feelings with friends, family, and your partner. Remember that your partner is also recovering emotionally. °· Make sure you have a good support system, and do not spend too much time alone. °· Meet with a pregnancy loss counselor or join a pregnancy loss support group. °· Get enough sleep and eat a healthy diet. Return to regular exercise  when you have recovered physically. °· Do not use drugs or alcohol to manage your emotions. °· Consider seeing a mental health professional to help you recover emotionally. °· Ask a friend or loved one to help you decide what to do with any clothing and nursery items you received for your baby. ° °How to recognize emotional stress °It is normal to have emotional stress after a pregnancy loss. But emotional stress that lasts a long time or becomes severe requires treatment. Watch out for these signs of severe emotional stress: °· Sadness, anger, or guilt that is not going away and is interfering with your normal activities. °· Relationship problems that have occurred or gotten worse since the pregnancy loss. °· Signs of depression that last longer than 2 weeks. These may include: °? Sadness. °? Anxiety. °? Hopelessness. °? Loss of interest in activities you enjoy. °? Inability to concentrate. °? Trouble sleeping or sleeping too much. °? Loss of appetite or overeating. °? Thoughts of death or of hurting yourself. °Follow these instructions at home: °Medicines °· Take over-the-counter and prescription medicines only as told by your health care provider. °Activity °· Rest at home until your energy level returns. Return to your normal activities as told by your health care provider. Ask your health care provider what activities are safe for you. °General instructions °· Keep all follow-up visits as told by your health care provider. This is important. °· It may be helpful to meet with others who have experienced pregnancy loss. Ask your health care provider about support groups and resources. °· To help you and your partner with the process of grieving, talk with your health care provider or seek counseling. °· When you are ready, meet with your health care provider to discuss steps to take for a future pregnancy. °Where to find more information °· [place] Department of Health and Human Services Office on Women's Health:  www.womenshealth.gov °· American Pregnancy Association: www.americanpregnancy.org °Contact a health care provider if: °· You continue to experience grief, sadness, or lack of motivation for everyday activities, and those feelings do not improve over time. °· You are struggling to recover emotionally, especially if you are using alcohol or substances to help. °Get help right away if: °· You have thoughts of hurting yourself or others. °If you ever feel like you may hurt yourself or others, or have thoughts about taking your own life, get help right away. You can go to your nearest emergency department or call: °· Your local emergency services (911 in the [place] °· A suicide crisis helpline, such as the National Suicide Prevention Lifeline at 1-800-273-8255. This is open 24 hours a day. °Summary °· Any pregnancy loss can be difficult physically and emotionally. °· You may experience many different emotions while you grieve. Emotional recovery can last longer than physical recovery. °· It is normal to have emotional stress after a pregnancy loss. But emotional stress that lasts a   long time or becomes severe requires treatment. °· See your health care provider if you are struggling emotionally after a pregnancy loss. °This information is not intended to replace advice given to you by your health care provider. Make sure you discuss any questions you have with your health care provider. °Document Released: 02/24/2018 Document Revised: 02/24/2018 Document Reviewed: 02/24/2018 °Elsevier Interactive Patient Education © 2019 [employer] Inc[employer] ° ° °

## 2024-11-01 NOTE — Op Note (Addendum)
 Operative Report Suction Dilation and Curettage   Indications: Vaginal bleeding, acute blood loss anemia   Pre-operative Diagnosis: Incomplete abortion at 10 weeks by scan, 12 weeks by LMP  Post-operative Diagnosis: same.  Procedure: 1. Suction D&C 2. Blood transfusion  Surgeon: Heather Penton, MD  Assistant(s):  None  Anesthesia: General LMA anesthesia  Anesthesiologist: Leavy Ned, MD Anesthesiologist: Leavy Ned, MD CRNA: Landy Hone D, CRNA  Estimated Blood Loss:  approx in clots in vagina and cervix, 50ml of surgical blood loss         Intraoperative medications: 1g TXA completed from the preop, 1u pRBCs, zofran . Paracervical dilute vasopressin         Total IV Fluids: 700ml  Urine Output: 25ml         Specimens: products of conception         Complications:  None; patient tolerated the procedure well.         Disposition: PACU - hemodynamically stable.         Condition: stable  Findings: Uterus measuring 9 weeks; normal cervix, vagina, perineum. Large volume clots in vagina, cervix dilated to >1cm with clots and products in canal. As soon as these were extracted, her blood loss slowed.  Indication for procedure/Consents: 28 y.o. G4P3003 at approx 12wks with known miscarriage, presented to the ED with heavy vaginal bleeding. Hgb dropped 1.5g since yesterday, but she did have a witnessed syncopal episode described as vasovagal in the ED, and we decided to move to the OR for D&C. In the preop holding area, she experienced another, and we moved her to the OR promptly. After anesthesia was induced, anesthesia team placed another IV and started the first unit of pRBCs and IVF. She had TXA hanging from the ED as well.   Risks of surgery were discussed with the patient including but not limited to: bleeding which may require transfusion; infection which may require antibiotics; injury to uterus or surrounding organs; intrauterine scarring which may impair  future fertility; need for additional procedures including laparotomy or laparoscopy; and other postoperative/anesthesia complications. Written informed consent was obtained.    Procedure Details:   T She was taken to the operating room where general anesthesia was administered and was found to be adequate.  After a formal and adequate timeout was performed, she was placed in the dorsal lithotomy position and examined with the above findings. She was then prepped and draped in the sterile manner.   Her bladder was catheterized for an estimated amount of clear, yellow urine. A bimaual found the clots and blood noted above and these were extracted manually. A speculum was then placed in the patient's vagina and a single tooth tenaculum was applied to the anterior lip of the cervix.    No uterine sounding was performed on this pregnant uterus. Her cervix was already dilated to accommodate a 12 sized rigid suction curette.  The products of conception remaining were removed. A cervical block was placed with vasopressin to control bleeding. Promptly after these two steps, her bleeding became scant to none. The suction canister did not have blood in it; all was captured in the strainer above. The tenaculum was removed from the anterior lip of the cervix and the vaginal speculum was removed after noting good hemostasis. The patient tolerated the procedure well and was taken to the recovery area awake, extubated and in stable condition.  The patient will be discharged to observation as per PACU criteria.  Because it is 1am, she will  be observed in house until the morning. We plan to hang a second unit of blood based on clinical observation of her hypovolemia, and recheck her hgb at that time.  Routine postoperative instructions given.  She was prescribed Percocet, Ibuprofen  and Colace.  She will follow up in the clinic in two weeks for postoperative evaluation.  Pt's family was at the bedside preoperatively, and I  spoke with her husband postoperatively and answered all questions. He was concerned that she wouldn't get blood transfusion in time to stabilize her and appears reassured.

## 2024-11-01 NOTE — Progress Notes (Signed)
 Iron transfusion completed. Patient feeling well. Discharge orders placed. Will follow up with Dr. Verdon for post-op appointment.   Therisa Pillow, CNM Certified Nurse Midwife [facility]

## 2024-11-01 NOTE — Anesthesia Procedure Notes (Signed)
 Procedure Name: Intubation Date/Time: 11/01/2024 12:12 AM  Performed by: Landy Francena BIRCH, CRNAPre-anesthesia Checklist: Patient identified, Emergency Drugs available, Suction available and Patient being monitored Patient Re-evaluated:Patient Re-evaluated prior to induction Oxygen Delivery Method: Circle system utilized Preoxygenation: Pre-oxygenation with 100% oxygen Induction Type: IV induction, Rapid sequence and Cricoid Pressure applied Laryngoscope Size: McGrath and 3 Grade View: Grade I Tube type: Oral Tube size: 7.0 mm Number of attempts: 1 Airway Equipment and Method: Stylet and Oral airway Placement Confirmation: ETT inserted through vocal cords under direct vision, positive ETCO2 and breath sounds checked- equal and bilateral Secured at: 22 cm Tube secured with: Tape Dental Injury: Teeth and Oropharynx as per pre-operative assessment

## 2024-11-01 NOTE — Progress Notes (Signed)
 Patient discharged. Discharge instructions given. Patient verbalizes understanding. Transported by axillary.

## 2024-11-01 NOTE — Anesthesia Postprocedure Evaluation (Signed)
 Anesthesia Post Note  Patient: Donna Reeves  Procedure(s) Performed: DILATION AND EVACUATION, UTERUS (Vagina )  Patient location during evaluation: PACU Anesthesia Type: General Level of consciousness: awake and alert Pain management: pain level controlled Vital Signs Assessment: post-procedure vital signs reviewed and stable Respiratory status: spontaneous breathing, nonlabored ventilation, respiratory function stable and patient connected to nasal cannula oxygen Cardiovascular status: blood pressure returned to baseline and stable Postop Assessment: no apparent nausea or vomiting Anesthetic complications: no Comments: Patient given 1 unit of PRBC in the OR and 1 unit in PACU. Will recheck H&H after 2nd unit of PRBC. Patient's blood pressure is stable and unsupported. Patient is resting comfortably in bed in PACU.    No notable events documented.   Last Vitals:  Vitals:   11/01/24 0044 11/01/24 0045  BP: 116/68 116/68  Pulse: 76 75  Resp: 11 15  Temp: (!) 35.6 C (!) 35.6 C  SpO2: 100% 100%    Last Pain:  Vitals:   11/01/24 0045  TempSrc: Temporal  PainSc:                  [name]

## 2024-11-01 NOTE — Anesthesia Preprocedure Evaluation (Signed)
 Anesthesia Evaluation  Patient identified by MRN, date of birth, ID band Patient awake    Reviewed: Allergy & Precautions, NPO status , Patient's Chart, lab work & pertinent test results  Airway Mallampati: III  TM Distance: >3 FB Neck ROM: full    Dental  (+) Chipped   Pulmonary neg pulmonary ROS   Pulmonary exam normal        Cardiovascular negative cardio ROS Normal cardiovascular exam     Neuro/Psych  Neuromuscular disease  negative psych ROS   GI/Hepatic negative GI ROS, Neg liver ROS,,,  Endo/Other  negative endocrine ROS    Renal/GU      Musculoskeletal   Abdominal   Peds  Hematology negative hematology ROS (+)   Anesthesia Other Findings Patient in and out of consciousness in the PACU. Last hemoglobin was 12 drawn in the ER but patient was very pale, stated she felt very cold and was starting to get confused. Patient consented for anesthesia and blood.   Past Medical History: No date: Bell's palsy No date: Degenerative disc disease No date: Herniated disc No date: Sciatica  Past Surgical History: 01/23/2021: LAPAROSCOPIC APPENDECTOMY; N/A     Comment:  Procedure: APPENDECTOMY LAPAROSCOPIC;  Surgeon: Mavis Anes, MD;  Location: [facility];  Service: General;                Laterality: N/A;  BMI    Body Mass Index: 30.38 kg/m      Reproductive/Obstetrics negative OB ROS                              Anesthesia Physical Anesthesia Plan  ASA: 4 and emergent  Anesthesia Plan: General ETT   Post-op Pain Management:    Induction: Intravenous and Rapid sequence  PONV Risk Score and Plan: 3 and Ondansetron , Dexamethasone  and Midazolam   Airway Management Planned: Oral ETT  Additional Equipment:   Intra-op Plan:   Post-operative Plan: Extubation in OR  Informed Consent: I have reviewed the patients History and Physical, chart, labs and discussed the  procedure including the risks, benefits and alternatives for the proposed anesthesia with the patient or authorized representative who has indicated his/her understanding and acceptance.     Dental Advisory Given  Plan Discussed with: Anesthesiologist, CRNA and Surgeon  Anesthesia Plan Comments: (Patient consented for risks of anesthesia including but not limited to:  - adverse reactions to medications - damage to eyes, teeth, lips or other oral mucosa - nerve damage due to positioning  - sore throat or hoarseness - Damage to heart, brain, nerves, lungs, other parts of body or loss of life  Patient voiced understanding and assent.)        Anesthesia Quick Evaluation

## 2024-11-01 NOTE — ED Notes (Signed)
 Transported pt to PACU on CCM

## 2024-11-01 NOTE — Transfer of Care (Signed)
 Immediate Anesthesia Transfer of Care Note  Patient: Donna Reeves  Procedure(s) Performed: DILATION AND EVACUATION, UTERUS (Vagina )  Patient Location: PACU  Anesthesia Type:General  Level of Consciousness: awake, alert , and oriented  Airway & Oxygen Therapy: Patient Spontanous Breathing and Patient connected to nasal cannula oxygen  Post-op Assessment: Report given to RN, Post -op Vital signs reviewed and stable, and Patient moving all extremities  Post vital signs: Reviewed and stable  Last Vitals:  Vitals Value Taken Time  BP 111/82 11/01/24 00:57  Temp 36.2 C 11/01/24 00:57  Pulse 77 11/01/24 00:59  Resp 26 11/01/24 00:59  SpO2 100 % 11/01/24 00:59  Vitals shown include unfiled device data.  Last Pain:  Vitals:   11/01/24 0057  TempSrc: Temporal  PainSc:          Complications: No notable events documented.

## 2024-11-01 NOTE — Discharge Summary (Signed)
 1 Day Post-Op       Procedure(s): DILATION AND EVACUATION, UTERUS (N/A) Subjective: The patient is doing well.  No nausea or vomiting. Pain is adequately controlled. Bleeding is scant this morning. One dose of dilaudid overnight, now pain is 2/10. Is feeling tired.  Objective: Vital signs in last 24 hours: Temp:  [96.1 F (35.6 C)-98.2 F (36.8 C)] 98 F (36.7 C) (11/05 0820) Pulse Rate:  [57-88] 73 (11/05 0820) Resp:  [11-23] 18 (11/05 0820) BP: (81-125)/(50-92) 96/52 (11/05 0820) SpO2:  [96 %-100 %] 96 % (11/05 0820) Weight:  [77.8 kg] 77.8 kg (11/04 2046)  Intake/Output  Intake/Output Summary (Last 24 hours) at 11/01/2024 0904 Last data filed at 11/01/2024 0325 Gross per 24 hour  Intake 2634.25 ml  Output 250 ml  Net 2384.25 ml    Physical Exam:  General: Alert and oriented. CV: RRR Lungs: Clear bilaterally. GI: Soft, Nondistended. Extremities: Nontender, no erythema, no edema.  Lab Results: Recent Labs    10/30/24 2121 10/31/24 2105 10/31/24 2341 11/01/24 0215 11/01/24 0758  HGB 12.6 11.2* 8.8* 11.1* 9.8*  HCT 35.2* 30.7* 25.2* 31.9* 26.8*  WBC 8.6 10.6*  --   --  9.3  PLT 285 298  --   --  223                 Results for orders placed or performed during the hospital encounter of 10/31/24 (from the past 24 hours)  CBC with Differential     Status: Abnormal   Collection Time: 10/31/24  9:05 PM  Result Value Ref Range   WBC 10.6 (H) 4.0 - 10.5 K/uL   RBC 3.75 (L) 3.87 - 5.11 MIL/uL   Hemoglobin 11.2 (L) 12.0 - 15.0 g/dL   HCT 69.2 (L) 63.9 - 53.9 %   MCV 81.9 80.0 - 100.0 fL   MCH 29.9 26.0 - 34.0 pg   MCHC 36.5 (H) 30.0 - 36.0 g/dL   RDW 87.4 88.4 - 84.4 %   Platelets 298 150 - 400 K/uL   nRBC 0.0 0.0 - 0.2 %   Neutrophils Relative % 77 %   Neutro Abs 8.2 (H) 1.7 - 7.7 K/uL   Lymphocytes Relative 15 %   Lymphs Abs 1.6 0.7 - 4.0 K/uL   Monocytes Relative 6 %   Monocytes Absolute 0.6 0.1 - 1.0 K/uL   Eosinophils Relative 1 %   Eosinophils  Absolute 0.1 0.0 - 0.5 K/uL   Basophils Relative 0 %   Basophils Absolute 0.0 0.0 - 0.1 K/uL   Immature Granulocytes 1 %   Abs Immature Granulocytes 0.05 0.00 - 0.07 K/uL  Comprehensive metabolic panel     Status: Abnormal   Collection Time: 10/31/24  9:05 PM  Result Value Ref Range   Sodium 135 135 - 145 mmol/L   Potassium 3.4 (L) 3.5 - 5.1 mmol/L   Chloride 104 98 - 111 mmol/L   CO2 22 22 - 32 mmol/L   Glucose, Bld 129 (H) 70 - 99 mg/dL   BUN 12 6 - 20 mg/dL   Creatinine, Ser 9.49 0.44 - 1.00 mg/dL   Calcium  8.9 8.9 - 10.3 mg/dL   Total Protein 6.9 6.5 - 8.1 g/dL   Albumin 3.7 3.5 - 5.0 g/dL   AST 14 (L) 15 - 41 U/L   ALT 11 0 - 44 U/L   Alkaline Phosphatase 57 38 - 126 U/L   Total Bilirubin 0.6 0.0 - 1.2 mg/dL   GFR, Estimated >39 >39  mL/min   Anion gap 9 5 - 15  hCG, quantitative, pregnancy     Status: Abnormal   Collection Time: 10/31/24  9:05 PM  Result Value Ref Range   hCG, Beta Chain, Quant, S 3,683 (H) <5 mIU/mL  Type and screen [facility]     Status: None (Preliminary result)   Collection Time: 10/31/24  9:05 PM  Result Value Ref Range   ABO/RH(D) A POS    Antibody Screen NEG    Sample Expiration 11/03/2024,2359    Unit Number [id number]    Blood Component Type RED CELLS,LR    Unit division 00    Status of Unit ISSUED    Transfusion Status OK TO TRANSFUSE    Crossmatch Result Compatible    Unit Number [id number]    Blood Component Type RED CELLS,LR    Unit division 00    Status of Unit ISSUED    Transfusion Status OK TO TRANSFUSE    Crossmatch Result Compatible    Unit Number [id number]    Blood Component Type RED CELLS,LR    Unit division 00    Status of Unit ALLOCATED    Transfusion Status OK TO TRANSFUSE    Crossmatch Result      Compatible Performed at [facility], [address], [place], [place] [address]    Unit Number [id number]    Blood Component Type RED CELLS,LR    Unit division 00    Status  of Unit ALLOCATED    Transfusion Status OK TO TRANSFUSE    Crossmatch Result Compatible    Unit Number [id number]    Blood Component Type RED CELLS,LR    Unit division 00    Status of Unit ALLOCATED    Transfusion Status OK TO TRANSFUSE    Crossmatch Result Compatible   Hemoglobin and hematocrit, blood     Status: Abnormal   Collection Time: 10/31/24 11:41 PM  Result Value Ref Range   Hemoglobin 8.8 (L) 12.0 - 15.0 g/dL   HCT 74.7 (L) 63.9 - 53.9 %  Prepare RBC (crossmatch)     Status: None   Collection Time: 10/31/24 11:59 PM  Result Value Ref Range   Order Confirmation      ORDER PROCESSED BY BLOOD BANK Performed at [facility], [address] [place], [place] [address]   Prepare RBC (crossmatch)     Status: None   Collection Time: 11/01/24 12:27 AM  Result Value Ref Range   Order Confirmation      ORDER PROCESSED BY BLOOD BANK Performed at [facility], [address] [place], [place] [address]   Hemoglobin and hematocrit, blood     Status: Abnormal   Collection Time: 11/01/24  2:15 AM  Result Value Ref Range   Hemoglobin 11.1 (L) 12.0 - 15.0 g/dL   HCT 68.0 (L) 63.9 - 53.9 %  CBC     Status: Abnormal   Collection Time: 11/01/24  7:58 AM  Result Value Ref Range   WBC 9.3 4.0 - 10.5 K/uL   RBC 3.32 (L) 3.87 - 5.11 MIL/uL   Hemoglobin 9.8 (L) 12.0 - 15.0 g/dL   HCT 73.1 (L) 63.9 - 53.9 %   MCV 80.7 80.0 - 100.0 fL   MCH 29.5 26.0 - 34.0 pg   MCHC 36.6 (H) 30.0 - 36.0 g/dL   RDW 86.9 88.4 - 84.4 %   Platelets 223 150 - 400 K/uL   nRBC 0.0 0.0 - 0.2 %  Assessment/Plan: 1 Day Post-Op       Procedure(s): DILATION AND EVACUATION, UTERUS (N/A) - Acute blood loss anemia: s/p 2u of pRBCs, plan for continuing oral iron at home. Will order iv iron infusion prior to discharge, expected today. -   -Ambulate, Incentive spirometry -Advance diet as tolerated - oral pain medication PRN -Discharge home today anticipated    Heather Penton,  MD   LOS: 0 days   Heather Penton 11/01/2024, 9:04 AM

## 2024-11-02 LAB — TYPE AND SCREEN
ABO/RH(D): A POS
Antibody Screen: NEGATIVE
Unit division: 0
Unit division: 0
Unit division: 0
Unit division: 0
Unit division: 0

## 2024-11-02 LAB — BPAM RBC
Blood Product Expiration Date: 202512012359
Blood Product Expiration Date: 202512012359
Blood Product Expiration Date: 202512012359
Blood Product Expiration Date: 202512062359
Blood Product Expiration Date: 202512062359
ISSUE DATE / TIME: 202511050002
ISSUE DATE / TIME: 202511050029
Unit Type and Rh: 5100
Unit Type and Rh: 5100
Unit Type and Rh: 5100
Unit Type and Rh: 6200
Unit Type and Rh: 6200

## 2024-11-02 LAB — PREPARE RBC (CROSSMATCH)

## 2024-11-02 LAB — SURGICAL PATHOLOGY
